# Patient Record
Sex: Female | Born: 1955 | Race: White | Hispanic: No | Marital: Single | Smoking: Former smoker
Health system: Southern US, Academic
[De-identification: ages and names within clinical notes are randomized; demographics above are authoritative.]

## PROBLEM LIST (undated history)

## (undated) DIAGNOSIS — E039 Hypothyroidism, unspecified: Secondary | ICD-10-CM

## (undated) DIAGNOSIS — I10 Essential (primary) hypertension: Secondary | ICD-10-CM

## (undated) DIAGNOSIS — L900 Lichen sclerosus et atrophicus: Secondary | ICD-10-CM

## (undated) DIAGNOSIS — Z9889 Other specified postprocedural states: Secondary | ICD-10-CM

## (undated) DIAGNOSIS — M8580 Other specified disorders of bone density and structure, unspecified site: Secondary | ICD-10-CM

## (undated) DIAGNOSIS — C4491 Basal cell carcinoma of skin, unspecified: Secondary | ICD-10-CM

## (undated) DIAGNOSIS — M1990 Unspecified osteoarthritis, unspecified site: Secondary | ICD-10-CM

## (undated) HISTORY — PX: SHOULDER ARTHROSCOPY: SHX128

## (undated) HISTORY — PX: CARPAL TUNNEL RELEASE: SHX101

## (undated) HISTORY — PX: TUBAL LIGATION: SHX77

## (undated) HISTORY — PX: KNEE ARTHROSCOPY: SUR90

## (undated) HISTORY — PX: SHOULDER SURGERY: SHX246

## (undated) HISTORY — PX: TRIGGER FINGER RELEASE: SHX641

## (undated) HISTORY — PX: MOHS SURGERY: SUR867

## (undated) HISTORY — DX: Hypothyroidism, unspecified: E03.9

## (undated) HISTORY — PX: HX TONSILLECTOMY: SHX27

## (undated) HISTORY — PX: MOLE REMOVAL: SHX2046

## (undated) HISTORY — DX: Essential (primary) hypertension: I10

## (undated) HISTORY — PX: HX WISDOM TEETH EXTRACTION: SHX21

## (undated) HISTORY — PX: DILATION AND CURETTAGE, DIAGNOSTIC / THERAPEUTIC: SUR384

---

## 2011-11-20 ENCOUNTER — Inpatient Hospital Stay (HOSPITAL_COMMUNITY): Payer: Self-pay | Admitting: INTERNAL MEDICINE

## 2011-11-20 ENCOUNTER — Other Ambulatory Visit: Payer: Self-pay

## 2011-11-21 LAB — HISTORICAL SURGICAL PATHOLOGY SPECIMEN

## 2019-08-29 HISTORY — PX: COLONOSCOPY: SHX174

## 2019-12-08 ENCOUNTER — Encounter: Payer: Self-pay | Admitting: Obstetrics & Gynecology

## 2019-12-22 ENCOUNTER — Other Ambulatory Visit (HOSPITAL_COMMUNITY)
Admission: RE | Admit: 2019-12-22 | Discharge: 2019-12-22 | Disposition: A | Discharge status: Home / Self Care | Payer: Self-pay | Source: Ambulatory Visit | Attending: Obstetrics & Gynecology | Admitting: Obstetrics & Gynecology

## 2019-12-22 ENCOUNTER — Encounter: Payer: Self-pay | Admitting: Obstetrics & Gynecology

## 2019-12-22 ENCOUNTER — Other Ambulatory Visit: Payer: Self-pay | Admitting: *Deleted

## 2019-12-22 ENCOUNTER — Ambulatory Visit: Payer: Self-pay | Admitting: Obstetrics & Gynecology

## 2019-12-22 ENCOUNTER — Other Ambulatory Visit: Payer: Self-pay

## 2019-12-22 VITALS — BP 150/88 | HR 83 | Ht 62.0 in | Wt 146.0 lb

## 2019-12-22 DIAGNOSIS — N904 Leukoplakia of vulva: Secondary | ICD-10-CM

## 2019-12-22 DIAGNOSIS — N952 Postmenopausal atrophic vaginitis: Secondary | ICD-10-CM

## 2019-12-22 MED ORDER — CLOBETASOL PROPIONATE 0.05 % EX OINT: 1.0000 "application " | TOPICAL_OINTMENT | Freq: Two times a day (BID) | CUTANEOUS | 0 refills | Status: DC

## 2019-12-22 MED ORDER — CLOBETASOL PROPIONATE 0.05 % EX OINT
1.0000 | TOPICAL_OINTMENT | Freq: Two times a day (BID) | CUTANEOUS | 0 refills | Status: DC
Start: 2019-12-22 — End: 2019-12-22

## 2019-12-22 NOTE — Patient Instructions (Signed)
Lichen Sclerosus Lichen sclerosus is a skin problem. It can happen on any part of the body, but it commonly involves the anal or genital areas. It can cause itching and discomfort in these areas. Treatment can help to control symptoms. When the genital area is affected, getting treatment is important because the condition can cause scarring that may lead to other problems. What are the causes? The cause of this condition is not known. It may be related to an overactive immune system or a lack of certain hormones. Lichen sclerosus is not an infection or a fungus, and it is not passed from one person to another (not contagious). What increases the risk? This condition is more likely to develop in women, usually after menopause. What are the signs or symptoms? Symptoms of this condition include:  Thin, wrinkled, white areas on the skin.  Thickened white areas on the skin.  Red and swollen patches (lesions) on the skin.  Tears or cracks in the skin.  Bruising.  Blood blisters.  Severe itching.  Pain, itching, or burning when urinating. Constipation is also common in people with lichen sclerosus. How is this diagnosed? This condition may be diagnosed with a physical exam. In some cases, a tissue sample (biopsy sample) may be removed to be looked at under a microscope. How is this treated? This condition is usually treated with medicated creams or ointments (topical steroids) that are applied over the affected areas. In some cases, treatment may also include medicines that are taken by mouth. Surgery may be needed in more severe cases that are causing problems such as scarring. Follow these instructions at home:  Take or use over-the-counter and prescription medicines only as told by your health care provider.  Use creams or ointments as told by your health care provider.  Do not scratch the affected areas of skin.  If you are a woman, be sure to keep the vaginal area as clean and dry  as possible.  Clean the affected area of skin gently with water. Avoid using rough towels or toilet paper.  Keep all follow-up visits as told by your health care provider. This is important. Contact a health care provider if:  You have increasing redness, swelling, or pain in the affected area.  You have fluid, blood, or pus coming from the affected area.  You have new lesions on your skin.  You have a fever.  You have pain during sex. Summary  Lichen sclerosus is a skin problem. When the genital area is affected, getting treatment is important because the condition can cause scarring that may lead to other problems.  This condition is usually treated with medicated creams or ointments (topical steroids) that are applied over the affected areas.  Take or use over-the-counter and prescription medicines only as told by your health care provider.  Contact a health care provider if you have new lesions on your skin, have pain during sex, or have increasing redness, swelling, or pain in the affected area.  Keep all follow-up visits as told by your health care provider. This is important. This information is not intended to replace advice given to you by your health care provider. Make sure you discuss any questions you have with your health care provider. Document Revised: 12/27/2017 Document Reviewed: 12/27/2017 Elsevier Patient Education  2020 Elsevier Inc.  

## 2019-12-22 NOTE — Progress Notes (Signed)
   Subjective:    Patient ID: Amanda Schultz, female    DOB: 1956-08-17, 64 y.o.   MRN: [id number]  HPI  64 yo female presents with vaginal itching and burning and occasional bleeding when she wipes.  The bleeding is coming from her external genitalia and can be seen with a mirror.  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  Approximately 3 years ago she started having the external pain and burning.  She is used Vagifem in the past which has not relieved her symptoms.  Patient is between insurances and would like to hold off on any expensive treatments or biopsies for 60 days until her insurance starts.  Review of Systems  Constitutional: Negative.   Respiratory: Negative.   Cardiovascular: Negative.   Gastrointestinal: Negative.   Genitourinary: Positive for dyspareunia and vaginal pain. Negative for vaginal discharge.  Psychiatric/Behavioral: Negative.       Objective:   Physical Exam Vitals reviewed.  Constitutional:      General: She is not in acute distress.    Appearance: She is well-developed.  HENT:     Head: Normocephalic and atraumatic.  Eyes:     Conjunctiva/sclera: Conjunctivae normal.  Cardiovascular:     Rate and Rhythm: Normal rate.  Pulmonary:     Effort: Pulmonary effort is normal.  Skin:    General: Skin is warm and dry.  Neurological:     Mental Status: She is alert and oriented to person, place, and time.   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  Patient will need biopsy to confirm lichen sclerosus.  Has insurance gap and would like to get the biopsy once her insurance is reinstated.  1.  Clobetasol  twice a day for 3 weeks.  Reviewed anatomy on picture with patient so she knows where to put the ointment. 2.  Patient cannot afford Vagifem at this time.  We will be using a compounded estrogen cream and she will insert this in the vagina twice a week. 3.  Patient should come back in 3 weeks for reevaluation.  If she is unable to afford the visit she will postpone we will talk by phone.  30 minutes spent face-to-face with patient with greater than 50% counseling.

## 2019-12-23 LAB — CERVICOVAGINAL ANCILLARY ONLY
Bacterial Vaginitis (gardnerella): NEGATIVE
Candida Glabrata: NEGATIVE
Candida Vaginitis: NEGATIVE
Comment: NEGATIVE
Comment: NEGATIVE
Comment: NEGATIVE

## 2019-12-24 ENCOUNTER — Telehealth: Payer: Self-pay | Admitting: *Deleted

## 2019-12-24 NOTE — Telephone Encounter (Signed)
Pt notified via phone that her Aptima swab was neg and she is to use the Estrogen and Steroid cream that Dr Penne Lash prescribed.

## 2019-12-24 NOTE — Telephone Encounter (Signed)
-----   Message from Lesly Dukes, MD sent at 12/23/2019 12:30 PM EDT ----- Pt needs a phone call (no my chart)

## 2020-01-19 ENCOUNTER — Other Ambulatory Visit: Payer: Self-pay

## 2020-01-19 ENCOUNTER — Encounter: Payer: Self-pay | Admitting: Obstetrics & Gynecology

## 2020-01-19 ENCOUNTER — Ambulatory Visit: Payer: Self-pay | Admitting: Obstetrics & Gynecology

## 2020-01-19 DIAGNOSIS — L309 Dermatitis, unspecified: Secondary | ICD-10-CM | POA: Insufficient documentation

## 2020-01-19 DIAGNOSIS — L900 Lichen sclerosus et atrophicus: Secondary | ICD-10-CM | POA: Insufficient documentation

## 2020-01-19 MED ORDER — MEDROXYPROGESTERONE ACETATE 10 MG PO TABS
10.0000 mg | ORAL_TABLET | Freq: Every day | ORAL | 2 refills | Status: DC
Start: 2020-01-19 — End: 2021-03-02

## 2020-01-19 NOTE — Progress Notes (Addendum)
   Subjective:    Patient ID: Amanda Schultz, female    DOB: 11/21/1955, 64 y.o.   MRN: [id number]  HPI  64 yo female presents for follow up of LS and atrophic vaginitis.  Pt using compounded cream due to cost.  Feels much better now.  Burning and tearing near the clitoris is resolved.    Review of Systems  Constitutional: Negative.   Respiratory: Negative.   Cardiovascular: Negative.   Gastrointestinal: Negative.   Genitourinary: Negative.  Negative for vaginal bleeding and vaginal discharge.       Objective:   Physical Exam Vitals reviewed.  Constitutional:      General: She is not in acute distress.    Appearance: She is well-developed.  HENT:     Head: Normocephalic and atraumatic.  Eyes:     Conjunctiva/sclera: Conjunctivae normal.  Cardiovascular:     Rate and Rhythm: Normal rate.  Pulmonary:     Effort: Pulmonary effort is normal.  Skin:    General: Skin is warm and dry.  Neurological:     Mental Status: She is alert and oriented to person, place, and time.    Vitals:   01/19/20 1505  BP: (!) 156/92  Pulse: 87  Weight: 147 lb (66.7 kg)  Height: 5\' 2"  (1.575 m)            Assessment & Plan:  64 yo female for follow up of LS and atrophic vaginitis.  1.  Continue clobetasol bid for one week then decrease to once a day. 2.  Continue compounded estrogen cream and provera w/d bleed last week of June to see if there is systemic absorption.   3.  Pt should be getting insurance coverage soon and will come back early July for biopsy.   Retake BP is 141/87  30 mins spent with patient during exam, counseling, and charting.

## 2020-01-19 NOTE — Patient Instructions (Signed)
   Pick up the provera once a day the last week of June  Continue twice a day clobetasol for one more week then once a week.  Make appt with me in early July for biopsy

## 2020-03-03 ENCOUNTER — Encounter (INDEPENDENT_AMBULATORY_CARE_PROVIDER_SITE_OTHER): Payer: Self-pay | Admitting: Family Medicine

## 2020-03-22 ENCOUNTER — Other Ambulatory Visit: Payer: Self-pay | Admitting: Obstetrics & Gynecology

## 2020-03-22 ENCOUNTER — Other Ambulatory Visit: Payer: Self-pay

## 2020-03-22 ENCOUNTER — Ambulatory Visit (INDEPENDENT_AMBULATORY_CARE_PROVIDER_SITE_OTHER): Payer: 59 | Admitting: Obstetrics & Gynecology

## 2020-03-22 ENCOUNTER — Encounter: Payer: Self-pay | Admitting: Obstetrics & Gynecology

## 2020-03-22 VITALS — Ht 62.0 in | Wt 148.0 lb

## 2020-03-22 DIAGNOSIS — N904 Leukoplakia of vulva: Secondary | ICD-10-CM

## 2020-03-22 DIAGNOSIS — N898 Other specified noninflammatory disorders of vagina: Secondary | ICD-10-CM

## 2020-03-22 DIAGNOSIS — N952 Postmenopausal atrophic vaginitis: Secondary | ICD-10-CM | POA: Diagnosis not present

## 2020-03-22 DIAGNOSIS — L909 Atrophic disorder of skin, unspecified: Secondary | ICD-10-CM

## 2020-03-23 MED ORDER — INTRAROSA 6.5 MG VA INST: VAGINAL_INSERT | VAGINAL | 5 refills | Status: DC

## 2020-03-23 NOTE — Progress Notes (Signed)
   Subjective:    Patient ID: Amanda Schultz, female    DOB: 1956/08/07, 64 y.o.   MRN: [id number]  HPI  64 yo female presents for vulvar biopsy (now has insurance) and re evaluation for LS and atrophic vaginitis.  Pt has been using meds as described.  She did not bled after provera.  Pt is using compounded estrogen progesterone cream in lieu of vagifem and intarasa due to insurance reasons.    Review of Systems  Constitutional: Negative.   Respiratory: Negative.   Cardiovascular: Negative.   Gastrointestinal: Negative.   Genitourinary:       Symptoms improved, less burning       Objective:   Physical Exam Vitals reviewed.  Constitutional:      General: She is not in acute distress.    Appearance: She is well-developed.  HENT:     Head: Normocephalic and atraumatic.  Eyes:     Conjunctiva/sclera: Conjunctivae normal.  Cardiovascular:     Rate and Rhythm: Normal rate.  Pulmonary:     Effort: Pulmonary effort is normal.  Genitourinary:    Comments: Vulva--LS improved Vagina--pal pink, improved Skin:    General: Skin is warm and dry.  Neurological:     Mental Status: She is alert and oriented to person, place, and time.    Vitals:   03/22/20 1423  Weight: 148 lb (67.1 kg)  Height: 5\' 2"  (1.575 m)       Assessment & Plan:  1.  Vulvar biopsy to confirm LS  VULVAR BIOPSY NOTE  The indications for vulvar biopsy (rule out neoplasia, establish lichen sclerosus diagnosis) were reviewed.   Risks of the biopsy including pain, bleeding, infection, inadequate specimen, scarring and need for additional procedures  were discussed. The patient stated understanding and agreed to undergo procedure today. Consent was signed,  time out performed.  The patient's vulva was prepped with Betadine. 1% lidocaine was injected into left labia majora. A 3-mm punch biopsy was done, biopsy tissue was picked up with sterile forceps and sterile scissors were used to excise the lesion.  Small  bleeding was noted and hemostasis was achieved using silver nitrate sticks.  The patient tolerated the procedure well. Post-procedure instructions  (pelvic rest for one week) were given to the patient. The patient is to call with heavy bleeding, fever greater than 100.4, foul smelling vaginal discharge or other concerns. The patient will be return to clinic in two weeks for discussion of results.  2.  Clobetasol twice a week 3.  Switch to Intrarosa if possible.  (coupon given).  Stop compounded estrogen cream / provera if able to switch to Intrarosa.

## 2020-03-24 ENCOUNTER — Telehealth: Payer: Self-pay

## 2020-03-24 DIAGNOSIS — L900 Lichen sclerosus et atrophicus: Secondary | ICD-10-CM

## 2020-03-24 MED ORDER — CLOBETASOL PROPIONATE 0.05 % EX OINT: 1.0000 "application " | TOPICAL_OINTMENT | CUTANEOUS | 0 refills | Status: DC

## 2020-03-24 NOTE — Telephone Encounter (Signed)
Pt called stating that Dr.Leggett told her she would send in refill of Temovate but, that it wasn't at the pharmacy. I reviewed Dr.Leggett's note that states she is to use Clobetasol twice a week and will send refill.

## 2020-03-30 ENCOUNTER — Other Ambulatory Visit: Payer: Self-pay | Admitting: *Deleted

## 2020-03-30 DIAGNOSIS — L900 Lichen sclerosus et atrophicus: Secondary | ICD-10-CM

## 2020-03-30 MED ORDER — CLOBETASOL PROPIONATE 0.05 % EX OINT: 1.0000 "application " | TOPICAL_OINTMENT | CUTANEOUS | 1 refills | Status: DC

## 2020-03-30 NOTE — Telephone Encounter (Signed)
Pt called requesting her pathology results.  The report showed Lichen Sclerosus.  She is using Clobetasol BIW.  I have reached out to Dr Penne Lash that she may contact the patient to discuss her results and when she would like to recheck her.  RF on Clobetasol sent to her pharmacy.

## 2020-05-11 ENCOUNTER — Other Ambulatory Visit: Payer: Self-pay

## 2020-05-11 ENCOUNTER — Encounter (INDEPENDENT_AMBULATORY_CARE_PROVIDER_SITE_OTHER): Payer: Self-pay | Admitting: Family Medicine

## 2020-05-11 ENCOUNTER — Ambulatory Visit: Payer: Medicare PPO | Attending: Family Medicine

## 2020-05-11 ENCOUNTER — Ambulatory Visit (INDEPENDENT_AMBULATORY_CARE_PROVIDER_SITE_OTHER): Payer: Medicare PPO | Admitting: Family Medicine

## 2020-05-11 VITALS — BP 124/82 | HR 67 | Temp 98.6°F | Resp 16 | Ht 63.0 in | Wt 138.0 lb

## 2020-05-11 DIAGNOSIS — I10 Essential (primary) hypertension: Secondary | ICD-10-CM

## 2020-05-11 DIAGNOSIS — Z124 Encounter for screening for malignant neoplasm of cervix: Secondary | ICD-10-CM

## 2020-05-11 DIAGNOSIS — Z1239 Encounter for other screening for malignant neoplasm of breast: Secondary | ICD-10-CM

## 2020-05-11 DIAGNOSIS — Z7689 Persons encountering health services in other specified circumstances: Secondary | ICD-10-CM

## 2020-05-11 DIAGNOSIS — Z1211 Encounter for screening for malignant neoplasm of colon: Secondary | ICD-10-CM

## 2020-05-11 DIAGNOSIS — Z0000 Encounter for general adult medical examination without abnormal findings: Secondary | ICD-10-CM

## 2020-05-11 DIAGNOSIS — E039 Hypothyroidism, unspecified: Secondary | ICD-10-CM

## 2020-05-11 LAB — LIPID PANEL
CHOL/HDL RATIO: 3.9
CHOLESTEROL: 272 mg/dL — ABNORMAL HIGH (ref 100–200)
HDL CHOL: 70 mg/dL (ref >=50)
LDL CALC: 177 mg/dL — ABNORMAL HIGH (ref <100)
NON-HDL: 202 mg/dL — ABNORMAL HIGH (ref <=190)
TRIGLYCERIDES: 125 mg/dL (ref <150)
VLDL CALC: 25 mg/dL (ref <30)

## 2020-05-11 LAB — COMPREHENSIVE METABOLIC PNL, FASTING
ALBUMIN: 4.2 g/dL (ref 3.4–4.8)
ALKALINE PHOSPHATASE: 73 U/L (ref 50–130)
ALT (SGPT): 26 U/L — ABNORMAL HIGH (ref 8–22)
ANION GAP: 11 mmol/L (ref 4–13)
AST (SGOT): 27 U/L (ref 8–45)
BILIRUBIN TOTAL: 0.4 mg/dL (ref 0.3–1.3)
BUN/CREA RATIO: 22 (ref 6–22)
BUN: 18 mg/dL (ref 8–25)
CALCIUM: 9.8 mg/dL (ref 8.8–10.2)
CHLORIDE: 100 mmol/L (ref 96–111)
CO2 TOTAL: 29 mmol/L (ref 23–31)
CREATININE: 0.81 mg/dL (ref 0.60–1.05)
ESTIMATED GFR: 77 mL/min/BSA (ref >=60)
GLUCOSE: 76 mg/dL (ref 70–99)
POTASSIUM: 3.8 mmol/L (ref 3.5–5.1)
PROTEIN TOTAL: 7.8 g/dL (ref 6.0–8.0)
SODIUM: 140 mmol/L (ref 136–145)

## 2020-05-11 LAB — THYROID STIMULATING HORMONE (SENSITIVE TSH): TSH: 5.545 u[IU]/mL — ABNORMAL HIGH (ref 0.430–3.550)

## 2020-05-11 MED ORDER — LEVOTHYROXINE 75 MCG TABLET
75.00 ug | ORAL_TABLET | Freq: Every morning | ORAL | 2 refills | Status: DC
Start: 2020-05-11 — End: 2020-05-13

## 2020-05-11 MED ORDER — TRIAMTERENE 37.5 MG-HYDROCHLOROTHIAZIDE 25 MG TABLET
1.0000 | ORAL_TABLET | Freq: Every day | ORAL | 2 refills | Status: DC
Start: 2020-05-11 — End: 2020-07-08

## 2020-05-11 NOTE — H&P (Signed)
[facility]                                                         [address] [place]   [place] [address]      Name: Katherine Shepard  Age: 64 y.o.  MRN: [id number]  Date:05/11/2020      Chief Complaint: Establish Care (Patient states that she is here to get established.)        HPI:  Katherine Shepard is a pleasant 64 y.o. patient here to establish care with me.  The patient takes the following medicaitons listed in the chart or/and from the patient's history  Current Outpatient Medications   Medication Sig   . levothyroxine (SYNTHROID) 75 mcg Oral Tablet Take 1 Tablet (75 mcg total) by mouth Every morning for 90 days   . triamterene-hydroCHLOROthiazide (MAXZIDE-25) 37.5-25 mg Oral Tablet Take 1 Tablet by mouth Once a day for 90 days      Management of the above conditions  -The patient takes the medications as prescribed. Stable overall.  -No complaints today  -Exercise involves. Avid walker  -Diet: Healthy, balanced, likes unsweetened tea but drinks plents  Due for Colon and mammogram sceening  Denies chest pain, SOB, syncope, lower leg edema, mood/behavoral abnormalities.   Review of Systems: Other than listed in the HPI negative for other systems.    Past Medical History:   Diagnosis Date   . Hypertension    . Hypothyroidism        Past Surgical History:   Procedure Laterality Date   . DILATION AND CURETTAGE, DIAGNOSTIC / THERAPEUTIC     . HX TONSILLECTOMY     . HX WISDOM TEETH EXTRACTION     . MOLE REMOVAL           Current Outpatient Medications   Medication Sig   . levothyroxine (SYNTHROID) 75 mcg Oral Tablet Take 1 Tablet (75 mcg total) by mouth Every morning for 90 days   . triamterene-hydroCHLOROthiazide (MAXZIDE-25) 37.5-25 mg Oral Tablet Take 1 Tablet by mouth Once a day for 90 days     No Known Allergies  Family Medical History:     Problem Relation (Age of Onset)    Heart Attack Father    Hypertension (High Blood Pressure) Mother, Father, Maternal Grandmother, Maternal  Grandfather    Thyroid Disease Mother, Father, Brother            Social History     Tobacco Use   . Smoking status: Former Games developer   . Smokeless tobacco: Never Used   Substance Use Topics   . Alcohol use: Yes          Objective :  Vitals:    05/11/20 1304   BP: 124/82   Pulse: 67   Resp: 16   Temp: 37 C (98.6 F)   TempSrc: Thermal Scan   SpO2: 98%   Weight: 62.6 kg (138 lb)   Height: 1.6 m (5\' 3" )   BMI: 24.5     Body mass index is 24.45 kg/m.  Nursing Notes:   , Benetta Spar  05/11/20 1308  Signed  BMI addressed: Advised on importance of nutrition to increase below normal BMI.     05/13/20, Benetta Spar  05/11/20 1303  Signed     05/11/20  1300   Depression Screen   Little interest or pleasure in doing things. 0   Feeling down, depressed, or hopeless 0     Physical Exam  Vitals and nursing note reviewed.   Constitutional:       Appearance: Normal appearance.   Cardiovascular:      Rate and Rhythm: Normal rate and regular rhythm.      Pulses: Normal pulses.      Heart sounds: Normal heart sounds. No murmur heard.   No friction rub. No gallop.    Pulmonary:      Effort: Pulmonary effort is normal.      Breath sounds: Normal breath sounds.   Musculoskeletal:         General: Normal range of motion.   Skin:     General: Skin is warm and dry.   Neurological:      Mental Status: She is alert.   Psychiatric:         Mood and Affect: Mood normal.         Behavior: Behavior normal.         Thought Content: Thought content normal.         Judgment: Judgment normal.         Assessment/Plan    Problem List Items Addressed This Visit        Cardiovascular System    Hypertension    Relevant Medications    triamterene-hydroCHLOROthiazide (MAXZIDE-25) 37.5-25 mg Oral Tablet    Other Relevant Orders    COMPREHENSIVE METABOLIC PNL, FASTING    LIPID PANEL       Endocrine    Hypothyroidism - Primary    Relevant Medications    levothyroxine (SYNTHROID) 75 mcg Oral Tablet    Other Relevant Orders    THYROID STIMULATING HORMONE  (SENSITIVE TSH)      Other Visit Diagnoses     Preventative health care        Encounter for screening for malignant neoplasm of breast, unspecified screening modality        Relevant Orders    MAMMO BILATERAL SCREENING-ADDL VIEWS/BREAST US AS REQ BY RAD    Colon cancer screening        Relevant Orders    Refer to [facility], [facility]    Cervical cancer screening             Above orutine tests  Mammogram/Colon/cervical screen referral  F/u in three months prn     Current Discharge Medication List          Accurate as of May 11, 2020  1:58 PM. If you have any questions, ask your nurse or doctor.            CONTINUE these medications - NO CHANGES were made during your visit.      Details   levothyroxine 75 mcg Tablet  Commonly known as: SYNTHROID   75 mcg, Oral, EVERY MORNING  Qty: 30 Tablet  Refills: 2     triamterene-hydroCHLOROthiazide 37.5-25 mg Tablet  Commonly known as: MAXZIDE-25   1 Tablet, Oral, DAILY  Qty: 30 Tablet  Refills: 2            I have reviewed Vitals and  chart in its entirety and concur with clinical staff on their assessments. BOP reports reviewed as required and discussed with patient.  All  Recent labs/procedures/ recent ED or quickcare visits reviwed if  received by our office . Requested if Not received  Patient / family has been  advised/counselled about medications prescribed and their benefits and side effects as per latest drug PI available. Also have counseled about compliance with advice and medications and lab follow up.I have answered all questions to patient's/family satisfaction regarding his medical managment.   The patient has been  informed to contact the office in 7 business days if a message/lab results/referral/imaging results have not been conveyed to the patient.   Follow-up as needed/dcoumented in chart  .    Return in about 3 months (around 08/10/2020).    Minervia Osso Jesusita Oka, MD

## 2020-05-11 NOTE — Nursing Note (Signed)
BMI addressed: Advised on importance of nutrition to increase below normal BMI.

## 2020-05-11 NOTE — Nursing Note (Signed)
05/11/20 1300   Depression Screen   Little interest or pleasure in doing things. 0   Feeling down, depressed, or hopeless 0

## 2020-05-12 NOTE — Progress Notes (Signed)
Results have been reviewed  Elevated TSH indicating Uncontrolled hypothyroidism.  Inquire if patient has been taking the thyroid medication.  For the last few weeks to few months.  If so medication needs to be adjusted and another laboratory TSH to be checked in 6 weeks

## 2020-05-13 ENCOUNTER — Telehealth (INDEPENDENT_AMBULATORY_CARE_PROVIDER_SITE_OTHER): Payer: Self-pay | Admitting: Family Medicine

## 2020-05-13 DIAGNOSIS — E039 Hypothyroidism, unspecified: Secondary | ICD-10-CM

## 2020-05-13 MED ORDER — LEVOTHYROXINE 88 MCG TABLET
88.0000 ug | ORAL_TABLET | Freq: Every morning | ORAL | 2 refills | Status: DC
Start: 2020-05-13 — End: 2020-08-16

## 2020-05-13 NOTE — Progress Notes (Signed)
Increased by 12.5 to and TSH ordered to be repeated on 06/26/2020

## 2020-05-13 NOTE — Telephone Encounter (Signed)
-----   Message from Dorothyann Peng, MD sent at 05/12/2020 12:03 PM EDT -----  Results have been reviewed  Elevated TSH indicating Uncontrolled hypothyroidism.  Inquire if patient has been taking the thyroid medication.  For the last few weeks to few months.  If so medication needs to be adjusted and another laboratory TSH to be checked in 6 weeks

## 2020-06-14 ENCOUNTER — Telehealth: Payer: Self-pay | Admitting: Obstetrics & Gynecology

## 2020-06-14 NOTE — Telephone Encounter (Signed)
Pt is on Intrarosa and was taking one week of Provera every 3 months in case the increase estrogen levels caused thickened endometrium.  After her second cycle of provera, she had 1 day of bright red spotting.  I am assuming this was in response to the progesterone.  I discussed the metabolism of Intrarosa into estrogen-like compounds that can affect the endometrium.  Patient verbalized understanding.  Patient is going to decrease the Intrarosa to 3 times a week.  She has good control of her symptoms.  He will continue to progesterone every 3 months.  She will let us know if she bleeds again.

## 2020-07-07 ENCOUNTER — Other Ambulatory Visit (INDEPENDENT_AMBULATORY_CARE_PROVIDER_SITE_OTHER): Payer: Self-pay | Admitting: Family Medicine

## 2020-07-07 DIAGNOSIS — I10 Essential (primary) hypertension: Secondary | ICD-10-CM

## 2020-07-26 ENCOUNTER — Ambulatory Visit (HOSPITAL_BASED_OUTPATIENT_CLINIC_OR_DEPARTMENT_OTHER): Payer: Self-pay | Admitting: OBSTETRICS/GYNECOLOGY

## 2020-08-10 ENCOUNTER — Ambulatory Visit (INDEPENDENT_AMBULATORY_CARE_PROVIDER_SITE_OTHER): Payer: Self-pay | Admitting: Family Medicine

## 2020-08-10 NOTE — Telephone Encounter (Signed)
Patient states that she needs a refill on her levothyroxine and wants to know if she needs lab work done first since it was resently adjusted?

## 2020-08-16 ENCOUNTER — Other Ambulatory Visit (INDEPENDENT_AMBULATORY_CARE_PROVIDER_SITE_OTHER): Payer: Self-pay | Admitting: Family Medicine

## 2020-08-16 DIAGNOSIS — E039 Hypothyroidism, unspecified: Secondary | ICD-10-CM

## 2020-08-16 NOTE — Telephone Encounter (Signed)
Patient asked for this refill and if she needed labs.

## 2020-08-17 MED ORDER — LEVOTHYROXINE 88 MCG TABLET
88.0000 ug | ORAL_TABLET | Freq: Every morning | ORAL | 2 refills | Status: DC
Start: 2020-08-17 — End: 2020-11-10

## 2020-08-31 ENCOUNTER — Ambulatory Visit (HOSPITAL_BASED_OUTPATIENT_CLINIC_OR_DEPARTMENT_OTHER): Payer: Self-pay

## 2020-09-07 ENCOUNTER — Encounter (INDEPENDENT_AMBULATORY_CARE_PROVIDER_SITE_OTHER): Payer: Self-pay | Admitting: Family Medicine

## 2020-09-21 ENCOUNTER — Other Ambulatory Visit: Payer: Self-pay

## 2020-09-21 ENCOUNTER — Encounter (INDEPENDENT_AMBULATORY_CARE_PROVIDER_SITE_OTHER): Payer: Self-pay | Admitting: NURSE PRACTITIONER

## 2020-09-21 ENCOUNTER — Ambulatory Visit (INDEPENDENT_AMBULATORY_CARE_PROVIDER_SITE_OTHER): Payer: Medicare PPO | Admitting: NURSE PRACTITIONER

## 2020-09-21 VITALS — BP 138/76 | HR 73 | Temp 98.2°F | Ht 63.0 in | Wt 142.0 lb

## 2020-09-21 DIAGNOSIS — J329 Chronic sinusitis, unspecified: Secondary | ICD-10-CM

## 2020-09-21 DIAGNOSIS — R0981 Nasal congestion: Secondary | ICD-10-CM

## 2020-09-21 DIAGNOSIS — F17200 Nicotine dependence, unspecified, uncomplicated: Secondary | ICD-10-CM

## 2020-09-21 MED ORDER — CEFDINIR 300 MG CAPSULE
300.0000 mg | ORAL_CAPSULE | Freq: Two times a day (BID) | ORAL | 0 refills | Status: AC
Start: 2020-09-21 — End: 2020-10-01

## 2020-09-21 MED ORDER — FEXOFENADINE 60 MG-PSEUDOEPHEDRINE ER 120 MG TABLET,EXT.RELEASE,12 HR
1.0000 | ORAL_TABLET | Freq: Two times a day (BID) | ORAL | 0 refills | Status: AC
Start: 2020-09-21 — End: 2020-09-28

## 2020-09-21 NOTE — Progress Notes (Deleted)
[address], [facility]  [address]  [place] [address]    Progress Note    Name: Katherine Shepard MRN:  [id number]   Date: 09/21/2020 Age: 65 y.o.         Reason for Visit: Ear Problem(s) (Bilat ear clogged with fluid, no pain. X3 weeks) and Sinus Problem (Sinus congestion with drainage and mucus)    History of Present Illness  Katherine Shepard is a 65 y.o. female who is being seen today for ***    Nursing Notes:  There are no exam notes on file for this visit.    Past Medical History:   Diagnosis Date   . Hypertension    . Hypothyroidism          Past Surgical History:   Procedure Laterality Date   . DILATION AND CURETTAGE, DIAGNOSTIC / THERAPEUTIC     . HX TONSILLECTOMY     . HX WISDOM TEETH EXTRACTION     . MOLE REMOVAL           Current Outpatient Medications   Medication Sig   . levothyroxine (SYNTHROID) 88 mcg Oral Tablet Take 1 Tablet (88 mcg total) by mouth Every morning for 90 days   . triamterene-hydroCHLOROthiazide (MAXZIDE-25) 37.5-25 mg Oral Tablet TAKE 1 TABLET BY MOUTH ONCE A DAY     Allergies   Allergen Reactions   . Erythromycin    . Tetracycline      Family Medical History:     Problem Relation (Age of Onset)    Heart Attack Father    Hypertension (High Blood Pressure) Mother, Father, Maternal Grandmother, Maternal Grandfather    Thyroid Disease Mother, Father, Brother          Social History     Tobacco Use   . Smoking status: Current Some Day Smoker   . Smokeless tobacco: Never Used   Vaping Use   . Vaping Use: Former   Substance Use Topics   . Alcohol use: Yes   . Drug use: Never       Review of Systems  ROS    Physical Exam:  BP 138/76   Pulse 73   Temp 36.8 C (98.2 F)   Ht 1.6 m (5\' 3" )   Wt 64.4 kg (142 lb)   SpO2 99%   BMI 25.15 kg/m       Physical Exam    Assessment:    No diagnosis found.        Plan:  ***    No orders of the defined types were placed in this encounter.      Follow up: No follow-ups on file.    Follow up with PCP.  Seek medical attention for new  or worsening symptoms.    This note was partially created using MModal Fluency Direct system (voice recognition software) and is inherently subject to errors including those of syntax and "sound-alike" substitutions which may escape proofreading.  In such instances, original meaning may be extrapolated by contextual derivation.     I saw the patient independently.    @The  supervising/collaborating physician for this visit was Dr. @         , FNP  09/21/2020, 08:59

## 2020-09-21 NOTE — Progress Notes (Signed)
[address], [facility]  [address]  [place] [address]    Progress Note    Name: Katherine Shepard MRN:  [id number]   Date: 09/21/2020 Age: 65 y.o.         Reason for Visit: Ear Problem(s) (Bilat ear clogged with fluid, no pain. X3 weeks) and Sinus Problem (Sinus congestion with drainage and mucus)    History of Present Illness  Katherine Shepard is a 65 y.o. female who is being seen today for The above stated symptoms, denies alleviating factors.    Nursing Notes:  There are no exam notes on file for this visit.    Past Medical History:   Diagnosis Date   . Hypertension    . Hypothyroidism          Past Surgical History:   Procedure Laterality Date   . DILATION AND CURETTAGE, DIAGNOSTIC / THERAPEUTIC     . HX TONSILLECTOMY     . HX WISDOM TEETH EXTRACTION     . MOLE REMOVAL           Current Outpatient Medications   Medication Sig   . cefdinir (OMNICEF) 300 mg Oral Capsule Take 1 Capsule (300 mg total) by mouth Twice daily for 10 days   . fexofenadine-pseudoephedrine (ALLEGRA-D) 60-120 mg Oral Tablet Sustained Release 12 hr Take 1 Tablet by mouth Twice daily for 7 days   . levothyroxine (SYNTHROID) 88 mcg Oral Tablet Take 1 Tablet (88 mcg total) by mouth Every morning for 90 days   . triamterene-hydroCHLOROthiazide (MAXZIDE-25) 37.5-25 mg Oral Tablet TAKE 1 TABLET BY MOUTH ONCE A DAY     Allergies   Allergen Reactions   . Erythromycin    . Tetracycline      Family Medical History:     Problem Relation (Age of Onset)    Heart Attack Father    Hypertension (High Blood Pressure) Mother, Father, Maternal Grandmother, Maternal Grandfather    Thyroid Disease Mother, Father, Brother          Social History     Tobacco Use   . Smoking status: Current Some Day Smoker   . Smokeless tobacco: Never Used   Vaping Use   . Vaping Use: Former   Substance Use Topics   . Alcohol use: Yes   . Drug use: Never       Review of Systems  Review of Systems   Constitutional: Negative for chills and fever.   HENT: Positive  for congestion, ear pain and sinus pain.        Physical Exam:  BP 138/76   Pulse 73   Temp 36.8 C (98.2 F)   Ht 1.6 m (5\' 3" )   Wt 64.4 kg (142 lb)   SpO2 99%   BMI 25.15 kg/m       Physical Exam  Vitals and nursing note reviewed.   Constitutional:       General: She is not in acute distress.  HENT:      Head: Normocephalic and atraumatic.      Right Ear: Tympanic membrane, ear canal and external ear normal.      Left Ear: Tympanic membrane, ear canal and external ear normal.      Ears:      Comments: Some cerumen buildup bilat. T tubes intact bilat     Nose: Congestion present.      Right Sinus: Maxillary sinus tenderness and frontal sinus tenderness present.  Left Sinus: Maxillary sinus tenderness and frontal sinus tenderness present.      Mouth/Throat:      Mouth: Mucous membranes are moist.      Pharynx: Oropharynx is clear.   Eyes:      Conjunctiva/sclera: Conjunctivae normal.   Cardiovascular:      Rate and Rhythm: Normal rate and regular rhythm.      Heart sounds: Normal heart sounds.   Pulmonary:      Effort: Pulmonary effort is normal. No respiratory distress.      Breath sounds: Normal breath sounds.   Lymphadenopathy:      Cervical: No cervical adenopathy.   Skin:     General: Skin is warm and dry.   Neurological:      Mental Status: She is alert and oriented to person, place, and time.   Psychiatric:         Mood and Affect: Affect normal.         Judgment: Judgment normal.         Assessment:    Sinusitis, unspecified chronicity, unspecified location    Sinus congestion        Plan:  Pt declined covid testing.  Reviewed last GFR    Orders Placed This Encounter   . cefdinir (OMNICEF) 300 mg Oral Capsule   . fexofenadine-pseudoephedrine (ALLEGRA-D) 60-120 mg Oral Tablet Sustained Release 12 hr       Follow up: No follow-ups on file.    Follow up with PCP.  Seek medical attention for new or worsening symptoms.    This note was partially created using MModal Fluency Direct system (voice  recognition software) and is inherently subject to errors including those of syntax and "sound-alike" substitutions which may escape proofreading.  In such instances, original meaning may be extrapolated by contextual derivation.     I saw the patient independently.            Jeani Sow, FNP  09/21/2020, 09:04

## 2020-09-28 ENCOUNTER — Ambulatory Visit (HOSPITAL_BASED_OUTPATIENT_CLINIC_OR_DEPARTMENT_OTHER): Payer: Medicare PPO | Admitting: OBSTETRICS/GYNECOLOGY

## 2020-10-27 ENCOUNTER — Telehealth: Payer: Self-pay | Admitting: *Deleted

## 2020-10-27 NOTE — Telephone Encounter (Signed)
Denial from [facility] for Intarosa.  Pharmacy made aware/

## 2020-11-01 NOTE — H&P (Unsigned)
GASTROENTEROLOGY, MEDICAL OFFICE BUILDING B  [address]  [place] [address]  [phone]    History and Physical  Name: Katherine Shepard  MRN: [id number]  DOB: Oct 08, 1955  Age: 65 y.o.  Date: 11/02/2020  Referring provider: Dorothyann Peng, MD    Reason for Visit: No chief complaint on file.    History of Present Illness:  Katherine Shepard is a 65 y.o. female who presents today for ***.  previous colonoscopy  family history of colon cancer or colon polyps.   Bowel movements  Denies fevers, chills, nausea, vomiting, heartburn, reflux, melena, hematochezia, or unintentional weight loss.     Patient History:  Past Medical History:   Diagnosis Date   . Hypertension    . Hypothyroidism          Past Surgical History:   Procedure Laterality Date   . DILATION AND CURETTAGE, DIAGNOSTIC / THERAPEUTIC     . HX TONSILLECTOMY     . HX WISDOM TEETH EXTRACTION     . MOLE REMOVAL           Current Outpatient Medications   Medication Sig   . levothyroxine (SYNTHROID) 88 mcg Oral Tablet Take 1 Tablet (88 mcg total) by mouth Every morning for 90 days   . triamterene-hydroCHLOROthiazide (MAXZIDE-25) 37.5-25 mg Oral Tablet TAKE 1 TABLET BY MOUTH ONCE A DAY     Allergies   Allergen Reactions   . Erythromycin    . Tetracycline      Family Medical History:     Problem Relation (Age of Onset)    Heart Attack Father    Hypertension (High Blood Pressure) Mother, Father, Maternal Grandmother, Maternal Grandfather    Thyroid Disease Mother, Father, Brother          Social History     Socioeconomic History   . Marital status: Single     Spouse name: Not on file   . Number of children: Not on file   . Years of education: Not on file   . Highest education level: Not on file   Occupational History   . Not on file   Tobacco Use   . Smoking status: Current Some Day Smoker   . Smokeless tobacco: Never Used   Vaping Use   . Vaping Use: Former   Substance and Sexual Activity   . Alcohol use: Yes   . Drug use: Never   . Sexual activity: Not Currently    Other Topics Concern   . Not on file   Social History Narrative   . Not on file     Social Determinants of Health     Financial Resource Strain: Not on file   Food Insecurity: Not on file   Transportation Needs: Not on file   Physical Activity: Not on file   Stress: Not on file   Intimate Partner Violence: Not on file   Housing Stability: Not on file     Review of Systems:                                      All other review of systems negative.     Physical Exam:  There were no vitals taken for this visit.      Physical Exam  Constitutional:       Appearance: She is well-developed.   HENT:      Head: Normocephalic.  Eyes:      Pupils: Pupils are equal, round, and reactive to light.   Cardiovascular:      Rate and Rhythm: Normal rate and regular rhythm.      Heart sounds: Normal heart sounds.   Pulmonary:      Effort: Pulmonary effort is normal.      Breath sounds: Normal breath sounds.   Abdominal:      General: Bowel sounds are normal. There is no distension.      Palpations: Abdomen is soft. There is no mass.      Tenderness: There is no abdominal tenderness. There is no guarding or rebound.   Musculoskeletal:         General: Normal range of motion.   Skin:     General: Skin is warm.      Findings: No erythema.   Neurological:      Mental Status: She is alert and oriented to person, place, and time.       Assessment and Plan:    Will schedule patient for screening colonoscopy. Discussed risks of procedure which include but are not limited to possible risk of bleeding, infection, perforation, anesthesia complications, and reviewed prep instructions, patient voiced understanding and wishes to proceed. Follow-up PRN.      Tama Headings, FNP-C  11/01/2020, 12:59    This note may have been partially generated using MModal Fluency Direct system, and there may be some incorrect words, spellings, and punctuation that were not noted in checking the note before saving, though effort was made to avoid such errors.

## 2020-11-02 ENCOUNTER — Ambulatory Visit (HOSPITAL_BASED_OUTPATIENT_CLINIC_OR_DEPARTMENT_OTHER): Payer: Self-pay | Admitting: Nurse Practitioner

## 2020-11-09 ENCOUNTER — Other Ambulatory Visit (INDEPENDENT_AMBULATORY_CARE_PROVIDER_SITE_OTHER): Payer: Self-pay | Admitting: Family Medicine

## 2020-11-09 DIAGNOSIS — E039 Hypothyroidism, unspecified: Secondary | ICD-10-CM

## 2020-11-09 NOTE — Telephone Encounter (Signed)
Please forward to Dr. Dan.    Katherine Shepard D Avyan Livesay, MD

## 2020-11-10 ENCOUNTER — Other Ambulatory Visit (INDEPENDENT_AMBULATORY_CARE_PROVIDER_SITE_OTHER): Payer: Self-pay | Admitting: Family Medicine

## 2020-11-10 DIAGNOSIS — E039 Hypothyroidism, unspecified: Secondary | ICD-10-CM

## 2020-11-10 NOTE — Telephone Encounter (Signed)
Left vm asking pt to return call.   Cathi Roan, MA  11/10/2020, 12:58

## 2020-11-10 NOTE — Telephone Encounter (Signed)
Left voicemail on answering machine to call office back.

## 2020-11-10 NOTE — Telephone Encounter (Signed)
Pt is coming in tomorrow for her TSH

## 2020-11-10 NOTE — Telephone Encounter (Signed)
Need TSH checked before this can be refilled.  Needs to do this immediately please  Thanks

## 2020-11-11 MED ORDER — LEVOTHYROXINE 88 MCG TABLET
88.0000 ug | ORAL_TABLET | Freq: Every morning | ORAL | 1 refills | Status: DC
Start: 2020-11-11 — End: 2020-12-01

## 2020-11-15 ENCOUNTER — Other Ambulatory Visit: Payer: Self-pay

## 2020-11-15 ENCOUNTER — Ambulatory Visit: Payer: Medicare PPO | Attending: Family Medicine

## 2020-11-15 DIAGNOSIS — E039 Hypothyroidism, unspecified: Secondary | ICD-10-CM | POA: Insufficient documentation

## 2020-11-15 LAB — THYROXINE, FREE (FREE T4): THYROXINE (T4), FREE: 1.24 ng/dL (ref 0.70–1.25)

## 2020-11-15 LAB — THYROID STIMULATING HORMONE WITH FREE T4 REFLEX: TSH: 0.4 u[IU]/mL — ABNORMAL LOW (ref 0.430–3.550)

## 2020-11-15 LAB — THYROID STIMULATING HORMONE (SENSITIVE TSH): TSH: 0.4 u[IU]/mL — ABNORMAL LOW (ref 0.430–3.550)

## 2020-11-17 ENCOUNTER — Ambulatory Visit (HOSPITAL_BASED_OUTPATIENT_CLINIC_OR_DEPARTMENT_OTHER): Payer: Medicare PPO | Admitting: OBSTETRICS/GYNECOLOGY

## 2020-11-20 ENCOUNTER — Other Ambulatory Visit: Payer: Self-pay | Admitting: Obstetrics & Gynecology

## 2020-12-01 ENCOUNTER — Encounter (INDEPENDENT_AMBULATORY_CARE_PROVIDER_SITE_OTHER): Payer: Self-pay | Admitting: Family Medicine

## 2020-12-01 ENCOUNTER — Ambulatory Visit: Payer: Medicare PPO | Attending: Family Medicine | Admitting: Family Medicine

## 2020-12-01 ENCOUNTER — Other Ambulatory Visit: Payer: Self-pay

## 2020-12-01 DIAGNOSIS — E039 Hypothyroidism, unspecified: Secondary | ICD-10-CM

## 2020-12-01 DIAGNOSIS — I10 Essential (primary) hypertension: Secondary | ICD-10-CM | POA: Insufficient documentation

## 2020-12-01 MED ORDER — TRIAMTERENE 37.5 MG-HYDROCHLOROTHIAZIDE 25 MG TABLET
1.0000 | ORAL_TABLET | Freq: Every day | ORAL | 0 refills | Status: DC
Start: 2020-12-01 — End: 2021-03-01

## 2020-12-01 MED ORDER — LEVOTHYROXINE 75 MCG TABLET
75.0000 ug | ORAL_TABLET | Freq: Every morning | ORAL | 0 refills | Status: DC
Start: 2020-12-01 — End: 2021-04-12

## 2020-12-01 NOTE — Progress Notes (Signed)
[facility]                                                         [address] [place]   [place] [address]    Name: Katherine Shepard  Age: 65 y.o.  MRN: [id number]  Date:12/01/2020    Chief Complaint: Lab Results (Pt states she is here for lab results. )   HPI:  Katherine Shepard is a pleasant 64 y.o. patient here for a follow up  The following conditions, either ongoing or resolved are listed in the chart.  Patient Active Problem List   Diagnosis   . Hypertension   . Hypothyroidism      The patient takes the following medications listed in the chart or/and from the patient's history  Current Outpatient Medications   Medication Sig   . levothyroxine (SYNTHROID) 75 mcg Oral Tablet Take 1 Tablet (75 mcg total) by mouth Every morning for 30 days   . triamterene-hydroCHLOROthiazide (MAXZIDE-25) 37.5-25 mg Oral Tablet Take 1 Tablet by mouth Once a day for 90 days      Management of the above conditions  -The patient takes the medications as prescribed. Stable overall.  -Current complaints-hypothyroidism,   THYROID STIMULATING HORMONE  Lab Results   Component Value Date    TSH 0.400 (L) 11/15/2020    TSH 0.400 (L) 11/15/2020     Reports no chest pain, SOB, syncope, lower leg edema, mood/behavorial abnormalities.   Review of Systems: Other than listed in the HPI negative for other systems.  Allergies   Allergen Reactions   . Erythromycin    . Tetracycline      Objective :  Vitals:    12/01/20 1550   BP: 128/74   Pulse: 75   Resp: 18   Temp: 36.5 C (97.7 F)   TempSrc: Oral   SpO2: 98%   Weight: (!) 140 kg (309 lb 1.4 oz)   Height: 1.6 m (5\' 3" )   BMI: 54.87     Body mass index is 54.75 kg/m.  Nursing Notes:   , [facility]  12/01/20 1550  Signed     12/01/20 1550   Depression Screen   Little interest or pleasure in doing things. 0   Feeling down, depressed, or hopeless 0   PHQ 2 Total 0     Physical Exam  Vitals and nursing note reviewed.   Cardiovascular:      Rate and Rhythm: Normal rate and  regular rhythm.      Pulses: Normal pulses.      Heart sounds: Normal heart sounds.   Pulmonary:      Effort: Pulmonary effort is normal.      Breath sounds: Normal breath sounds.   Psychiatric:         Mood and Affect: Mood normal.         Behavior: Behavior normal.         Thought Content: Thought content normal.         PMH, Surgical H/o , FH, Social H/o, Medications, allergies reviewed and updated as appropriate     BASIC METABOLIC PANEL  Lab Results   Component Value Date    SODIUM 140 05/11/2020    POTASSIUM 3.8 05/11/2020    CHLORIDE 100 05/11/2020    CO2  29 05/11/2020    ANIONGAP 11 05/11/2020    BUN 18 05/11/2020    CREATININE 0.81 05/11/2020    BUNCRRATIO 22 05/11/2020    GFR 77 05/11/2020    CALCIUM 9.8 05/11/2020    GLUCOSENF 76 05/11/2020         No results found for: GLUCOSEFAST   Lab Results   Component Value Date    CHOLESTEROL 272 (H) 05/11/2020    HDLCHOL 70 05/11/2020    LDLCHOL 177 (H) 05/11/2020    TRIG 125 05/11/2020       Hepatic Function  Lab Results   Component Value Date    ALBUMIN 4.2 05/11/2020    TOTALPROTEIN 7.8 05/11/2020    ALKPHOS 73 05/11/2020    AST 27 05/11/2020    ALT 26 (H) 05/11/2020   Renal Function   Lab Results   Component Value Date    SODIUM 140 05/11/2020    POTASSIUM 3.8 05/11/2020    CHLORIDE 100 05/11/2020    CO2 29 05/11/2020    ANIONGAP 11 05/11/2020    BUN 18 05/11/2020    CREATININE 0.81 05/11/2020    CALCIUM 9.8 05/11/2020    ALBUMIN 4.2 05/11/2020       THYROID STIMULATING HORMONE  Lab Results   Component Value Date    TSH 0.400 (L) 11/15/2020    TSH 0.400 (L) 11/15/2020     Assessment/Plan    Problem List Items Addressed This Visit        Endocrine    Hypothyroidism    Relevant Medications    levothyroxine (SYNTHROID) 75 mcg Oral Tablet    Other Relevant Orders    THYROID STIMULATING HORMONE (SENSITIVE TSH)      Other Visit Diagnoses     Essential hypertension        Relevant Medications    triamterene-hydroCHLOROthiazide (MAXZIDE-25) 37.5-25 mg Oral Tablet       Decrease dose of levothyroxine, recheck TSH in 6 weeks  Diagnostics/therapeutics/referrals per the above diagnosis/diagnoses  Follow up on the results.  Follow up in person or thorough phone/electronic/mail per the clinical need/complexity  Patient to let us know if any concerns/symptoms  If all is well and no complaints patient can be seen again per the below listed follow up schedule.  It is advised patient complete tests/preventative care recommendations before next visit as needed and recommended      Current Discharge Medication List          Accurate as of December 01, 2020  4:25 PM. If you have any questions, ask your nurse or doctor.            CONTINUE these medications which have CHANGED during your visit.      Details   levothyroxine 75 mcg Tablet  Commonly known as: SYNTHROID  What changed:    medication strength   how much to take  Changed by: Med Jesusita Oka, MD   75 mcg, Oral, EVERY MORNING  Qty: 30 Tablet  Refills: 0        CONTINUE these medications - NO CHANGES were made during your visit.      Details   triamterene-hydroCHLOROthiazide 37.5-25 mg Tablet  Commonly known as: MAXZIDE-25   1 Tablet, Oral, DAILY  Qty: 90 Tablet  Refills: 0          I have reviewed Vitals and  chart in its entirety and concur with clinical staff on their assessments. BOP reports reviewed as required and discussed with patient.  All  Recent labs/procedures/ recent ED or quick care visits reviewed if  received by our office . Requested if Not received   Patient / family has been  advised/counseled about medications prescribed and their benefits and side effects as per latest drug PI available. Also have counseled about compliance with advice and medications and lab follow up.I have answered all questions to patient's/family satisfaction regarding his medical management.   The patient has been  informed to contact the office in 7 business days if a message/lab results/referral/imaging results have not been conveyed to the  patient.   Follow-up as needed/documented in chart    Return in about 3 months (around 03/02/2021).    Med Jesusita Oka, MD

## 2020-12-01 NOTE — Nursing Note (Signed)
12/01/20 1550   Depression Screen   Little interest or pleasure in doing things. 0   Feeling down, depressed, or hopeless 0   PHQ 2 Total 0

## 2020-12-06 ENCOUNTER — Ambulatory Visit: Payer: 59 | Admitting: Obstetrics & Gynecology

## 2020-12-27 ENCOUNTER — Ambulatory Visit: Payer: Medicare Other | Admitting: Obstetrics & Gynecology

## 2021-01-03 ENCOUNTER — Other Ambulatory Visit: Payer: Self-pay

## 2021-01-03 ENCOUNTER — Ambulatory Visit: Payer: Medicare Other | Admitting: Obstetrics & Gynecology

## 2021-01-03 ENCOUNTER — Encounter: Payer: Self-pay | Admitting: Obstetrics & Gynecology

## 2021-01-03 VITALS — BP 165/91 | HR 93 | Resp 16 | Ht 62.0 in | Wt 144.0 lb

## 2021-01-03 DIAGNOSIS — N952 Postmenopausal atrophic vaginitis: Secondary | ICD-10-CM

## 2021-01-03 DIAGNOSIS — L900 Lichen sclerosus et atrophicus: Secondary | ICD-10-CM | POA: Diagnosis not present

## 2021-01-03 DIAGNOSIS — R030 Elevated blood-pressure reading, without diagnosis of hypertension: Secondary | ICD-10-CM | POA: Diagnosis not present

## 2021-01-03 MED ORDER — MEDROXYPROGESTERONE ACETATE 10 MG PO TABS
10.0000 mg | ORAL_TABLET | Freq: Every day | ORAL | 0 refills | Status: DC
Start: 2021-01-03 — End: 2021-03-02

## 2021-01-03 MED ORDER — ESTRADIOL 10 MCG VA TABS
ORAL_TABLET | VAGINAL | 12 refills | Status: DC
Start: 2021-01-03 — End: 2023-03-19

## 2021-01-03 MED ORDER — CLOBETASOL PROPIONATE 0.05 % EX OINT: 1.0000 "application " | TOPICAL_OINTMENT | CUTANEOUS | 1 refills | Status: DC

## 2021-01-03 NOTE — Progress Notes (Signed)
Pt states that she ran out of Provera and wasn't able to get a RF.Vaginal bump on Left vaginal area.

## 2021-01-03 NOTE — Progress Notes (Signed)
   Subjective:    Patient ID: Amanda Schultz, female    DOB: 05-31-1956, 65 y.o.   MRN: [id number]  HPI 65 year old female presents for request of change of medications for atrophic vaginitis.  Patient recently became on a Medicare advantage plan and they will not pay for Intrarosa.  They do pay for estradiol vaginal tablets.  Patient can stop using progesterone since she is moving to a generic Vagifem.  Patient has had some increased vaginal burning and would like me to look at her lichen sclerosus.  Patient has undergone a stressful time lately.  Her husband accidentally shot himself in the leg with a gun and needed surgery and rehab.  Last pap Nov 2019, normal co testing  Review of Systems  Constitutional: Negative.   Respiratory: Negative.   Cardiovascular: Negative.   Gastrointestinal: Negative.   Genitourinary: Negative.        Objective:   Physical Exam Vitals reviewed.  Constitutional:      General: She is not in acute distress.    Appearance: She is well-developed.  HENT:     Head: Normocephalic and atraumatic.  Eyes:     Conjunctiva/sclera: Conjunctivae normal.  Cardiovascular:     Rate and Rhythm: Normal rate.  Pulmonary:     Effort: Pulmonary effort is normal.  Genitourinary:   Skin:    General: Skin is warm and dry.  Neurological:     Mental Status: She is alert and oriented to person, place, and time.    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  Due to insurance changes, we will switch to estradiol generic Vagifem.  Patient can stop taking Provera every 3 months. 2.  Reviewed placement of clobetasol.  Patient had been placing it more on the labia majora.  Patient encouraged to spread labia majora and place at the fourchette of the vagina as demonstrated on the diagram in the physical exam  portion of this note. 3.  RTC 6 months or sooner if not doing better.   33 minutes were spent reviewing patient's records, biopsy reports, physical exam and interview, and counseling.

## 2021-01-03 NOTE — Patient Instructions (Signed)
Use clobetasol twice a day for 1 week, then once a day for 2 weeks, then twice a week if feeling better.  If not feeling better, then continue daily .  Call us if not better.

## 2021-02-03 ENCOUNTER — Encounter (INDEPENDENT_AMBULATORY_CARE_PROVIDER_SITE_OTHER): Payer: Self-pay

## 2021-03-01 ENCOUNTER — Other Ambulatory Visit (INDEPENDENT_AMBULATORY_CARE_PROVIDER_SITE_OTHER): Payer: Self-pay | Admitting: Family Medicine

## 2021-03-01 DIAGNOSIS — I10 Essential (primary) hypertension: Secondary | ICD-10-CM

## 2021-03-02 ENCOUNTER — Ambulatory Visit (INDEPENDENT_AMBULATORY_CARE_PROVIDER_SITE_OTHER): Payer: Medicare Other | Admitting: Obstetrics & Gynecology

## 2021-03-02 ENCOUNTER — Encounter: Payer: Self-pay | Admitting: Obstetrics & Gynecology

## 2021-03-02 ENCOUNTER — Other Ambulatory Visit (HOSPITAL_COMMUNITY)
Admission: RE | Admit: 2021-03-02 | Discharge: 2021-03-02 | Disposition: A | Discharge status: Home / Self Care | Payer: Medicare Other | Source: Ambulatory Visit | Attending: Obstetrics & Gynecology | Admitting: Obstetrics & Gynecology

## 2021-03-02 ENCOUNTER — Other Ambulatory Visit: Payer: Self-pay

## 2021-03-02 VITALS — BP 167/87 | HR 89 | Resp 16 | Ht 62.0 in | Wt 140.0 lb

## 2021-03-02 DIAGNOSIS — Z1151 Encounter for screening for human papillomavirus (HPV): Secondary | ICD-10-CM | POA: Insufficient documentation

## 2021-03-02 DIAGNOSIS — L900 Lichen sclerosus et atrophicus: Secondary | ICD-10-CM

## 2021-03-02 DIAGNOSIS — Z01419 Encounter for gynecological examination (general) (routine) without abnormal findings: Secondary | ICD-10-CM | POA: Insufficient documentation

## 2021-03-02 DIAGNOSIS — N952 Postmenopausal atrophic vaginitis: Secondary | ICD-10-CM

## 2021-03-02 MED ORDER — INTRAROSA 6.5 MG VA INST: VAGINAL_INSERT | VAGINAL | 3 refills | Status: DC

## 2021-03-02 MED ORDER — CLOBETASOL PROPIONATE 0.05 % EX OINT: 1.0000 "application " | TOPICAL_OINTMENT | CUTANEOUS | 1 refills | Status: DC

## 2021-03-02 MED ORDER — MEDROXYPROGESTERONE ACETATE 10 MG PO TABS: 10.0000 mg | ORAL_TABLET | Freq: Every day | ORAL | 2 refills | Status: DC

## 2021-03-02 NOTE — Progress Notes (Signed)
Subjective:     Amanda Schultz is a 65 y.o. female here for a routine exam and evaluation of atrophic vaginitis and LS.  Current complaints: feels better with change in clobetasol application.     Gynecologic History No LMP recorded. Patient is postmenopausal. Contraception: post menopausal status Last Pap: 3 years ago. Results were: normal per patient Last mammogram: 2020 at [facility]. Results were: normal  Obstetric History OB History  Gravida Para Term Preterm AB Living  0 0 0 0 0 0  SAB IAB Ectopic Multiple Live Births  0 0 0 0 0     The following portions of the patient's history were reviewed and updated as appropriate: allergies, current medications, past family history, past medical history, past social history, past surgical history, and problem list.  Review of Systems Pertinent items noted in HPI and remainder of comprehensive ROS otherwise negative.    Objective:     Vitals:   03/02/21 0848  BP: (!) 167/87  Pulse: 89  Resp: 16  Weight: 140 lb (63.5 kg)  Height: 5\' 2"  (1.575 m)   Vitals:  WNL General appearance: alert, cooperative and no distress  HEENT: Normocephalic, without obvious abnormality, atraumatic Eyes: negative Throat: lips, mucosa, and tongue normal; teeth and gums normal  Respiratory: Clear to auscultation bilaterally  CV: Regular rate and rhythm  Breasts:  Normal appearance, no masses or tenderness, no nipple retraction or dimpling  GI: Soft, non-tender; bowel sounds normal; no masses,  no organomegaly  GU: External Genitalia:  Tanner V, no lesion Urethra:  No prolapse   Vagina: Pink, normal rugae, no blood or discharge  Cervix: No CMT, no lesion  Uterus:  Normal size and contour, non tender  Adnexa: Normal, no masses, non tender  Musculoskeletal: No edema, redness or tenderness in the calves or thighs  Skin: No lesions or rash  Lymphatic: Axillary adenopathy: none     Psychiatric: Normal mood and behavior   Assessment:    Healthy female  exam.    Plan:    1.  Pap with co testing 2.  Yearly mammograms 3.  Patient would like to continue Intrarosa due to cost of Vagifem.  Patient is going to decrease to twice a week then once a week with the goal of possibly stopping.  Given that she had bleeding once after Intrarosa.  We will try Provera withdrawal bleeds every 3 months. 4.  Continue using clobetasol twice a week for lichen sclerosus 5.  Follow-up with PCP for hypertension and other medical concerns. 6.  DEXA ordered.  Patient given calcium diet worksheet to ensure that she is getting 1200 mg of calcium a day as well as 400 units of vitamin D.  Weight bearing exercise encouraged.

## 2021-03-03 ENCOUNTER — Encounter (INDEPENDENT_AMBULATORY_CARE_PROVIDER_SITE_OTHER): Payer: Self-pay | Admitting: Family Medicine

## 2021-03-08 LAB — CYTOLOGY - PAP
Comment: NEGATIVE
Diagnosis: NEGATIVE
High risk HPV: NEGATIVE

## 2021-03-14 ENCOUNTER — Ambulatory Visit (HOSPITAL_BASED_OUTPATIENT_CLINIC_OR_DEPARTMENT_OTHER): Payer: Medicare PPO | Admitting: Family Medicine

## 2021-03-14 DIAGNOSIS — Z0000 Encounter for general adult medical examination without abnormal findings: Secondary | ICD-10-CM

## 2021-03-23 ENCOUNTER — Ambulatory Visit (INDEPENDENT_AMBULATORY_CARE_PROVIDER_SITE_OTHER): Payer: Medicare Other

## 2021-03-23 ENCOUNTER — Other Ambulatory Visit: Payer: Self-pay

## 2021-03-23 DIAGNOSIS — Z1231 Encounter for screening mammogram for malignant neoplasm of breast: Secondary | ICD-10-CM

## 2021-03-23 DIAGNOSIS — Z780 Asymptomatic menopausal state: Secondary | ICD-10-CM | POA: Diagnosis not present

## 2021-03-23 DIAGNOSIS — M8580 Other specified disorders of bone density and structure, unspecified site: Secondary | ICD-10-CM | POA: Diagnosis not present

## 2021-03-23 DIAGNOSIS — Z01419 Encounter for gynecological examination (general) (routine) without abnormal findings: Secondary | ICD-10-CM

## 2021-04-12 ENCOUNTER — Other Ambulatory Visit (INDEPENDENT_AMBULATORY_CARE_PROVIDER_SITE_OTHER): Payer: Self-pay | Admitting: Family Medicine

## 2021-04-12 DIAGNOSIS — I10 Essential (primary) hypertension: Secondary | ICD-10-CM

## 2021-04-12 DIAGNOSIS — E039 Hypothyroidism, unspecified: Secondary | ICD-10-CM

## 2021-04-13 ENCOUNTER — Encounter (INDEPENDENT_AMBULATORY_CARE_PROVIDER_SITE_OTHER): Payer: Self-pay

## 2021-04-13 MED ORDER — LEVOTHYROXINE 75 MCG TABLET
75.0000 ug | ORAL_TABLET | Freq: Every morning | ORAL | 1 refills | Status: DC
Start: 2021-04-13 — End: 2021-04-21

## 2021-04-13 NOTE — Telephone Encounter (Signed)
Refill on July 5,2022 was for a 3 month supply

## 2021-04-14 NOTE — Telephone Encounter (Signed)
Due f/u appt  Due TSH ordered not completed  Get above done before refilling

## 2021-04-14 NOTE — Telephone Encounter (Signed)
Called patient with instructions and she understands and will call pharmacy

## 2021-04-15 NOTE — Telephone Encounter (Signed)
Called patient with instructions. Patient will come in Monday for lab work.

## 2021-04-18 ENCOUNTER — Encounter: Payer: Self-pay | Admitting: Obstetrics & Gynecology

## 2021-04-18 DIAGNOSIS — Z780 Asymptomatic menopausal state: Secondary | ICD-10-CM | POA: Insufficient documentation

## 2021-04-19 ENCOUNTER — Telehealth: Payer: Self-pay | Admitting: *Deleted

## 2021-04-19 NOTE — Telephone Encounter (Signed)
A copy of BMD and mammogram mailed to pt's home address since she does not have my chart.  A Ca worksheet also mailed with instructions.

## 2021-04-20 ENCOUNTER — Ambulatory Visit: Payer: Medicare PPO | Attending: Family Medicine

## 2021-04-20 ENCOUNTER — Other Ambulatory Visit: Payer: Self-pay

## 2021-04-20 DIAGNOSIS — E039 Hypothyroidism, unspecified: Secondary | ICD-10-CM | POA: Insufficient documentation

## 2021-04-20 LAB — THYROID STIMULATING HORMONE (SENSITIVE TSH): TSH: 0.181 u[IU]/mL — ABNORMAL LOW (ref 0.430–3.550)

## 2021-04-21 ENCOUNTER — Other Ambulatory Visit (INDEPENDENT_AMBULATORY_CARE_PROVIDER_SITE_OTHER): Payer: Self-pay | Admitting: Family Medicine

## 2021-04-21 DIAGNOSIS — E039 Hypothyroidism, unspecified: Secondary | ICD-10-CM

## 2021-04-21 MED ORDER — LEVOTHYROXINE 50 MCG TABLET
50.0000 ug | ORAL_TABLET | Freq: Every morning | ORAL | 2 refills | Status: DC
Start: 2021-04-21 — End: 2021-07-05

## 2021-04-21 NOTE — Telephone Encounter (Signed)
Reviewed result  We check Thyroid lab levels for this reason to make appropriate adjustment.  If patient has been taking the as prescribed We need to go down to and recheck TSH in 6 weeks  Notify and forward script.

## 2021-04-21 NOTE — Telephone Encounter (Signed)
TSH high means he needs more of the medication. TSH low means he need less of the medication.  We increase or decrease or keep the same dose based on the TSH. Decrease dose to and then recheck TSH in 6 weeks. Is this clear? If not set up a tele visit as medication management.    Do not hesitate to contact our office if there are any concerns or questions.

## 2021-04-21 NOTE — Result Encounter Note (Signed)
See other task Re: adjustment and f/u labs for thyroid disease

## 2021-05-10 ENCOUNTER — Other Ambulatory Visit: Payer: Self-pay

## 2021-05-10 ENCOUNTER — Ambulatory Visit (INDEPENDENT_AMBULATORY_CARE_PROVIDER_SITE_OTHER): Payer: Medicare PPO | Admitting: OBSTETRICS/GYNECOLOGY

## 2021-05-10 ENCOUNTER — Encounter (HOSPITAL_BASED_OUTPATIENT_CLINIC_OR_DEPARTMENT_OTHER): Payer: Self-pay | Admitting: OBSTETRICS/GYNECOLOGY

## 2021-05-10 VITALS — BP 143/75 | Ht 63.0 in | Wt 135.0 lb

## 2021-05-10 DIAGNOSIS — Z13820 Encounter for screening for osteoporosis: Secondary | ICD-10-CM

## 2021-05-10 DIAGNOSIS — Z1231 Encounter for screening mammogram for malignant neoplasm of breast: Secondary | ICD-10-CM

## 2021-05-10 DIAGNOSIS — Z01419 Encounter for gynecological examination (general) (routine) without abnormal findings: Secondary | ICD-10-CM

## 2021-05-10 DIAGNOSIS — Z124 Encounter for screening for malignant neoplasm of cervix: Secondary | ICD-10-CM

## 2021-05-10 NOTE — Progress Notes (Signed)
05/10/2021    Katherine Shepard    Chief Complaint   Patient presents with   . Annual Exam       History of Present Illness    Pt presents today for a new pt annual exam. PCP, Dr. Jesusita Shepard, checks screening labs. Family hx of colon cancer, (aunt). Pt states she is unable to get a colonoscopy because she doesn not have a ride home after procedure. Pt encouraged to discuss the Cologuard with PCP. States is feeling well.  No vaginal bleeding.  Is not sexually active.    No bowel or bladder changes/concerns. No s/s vaginitis. No breast problems.  Pt. verb. no other complaints/concerns.         History    Past Medical History:   Diagnosis Date   . Hypertension    . Hypothyroidism           Past Surgical History:   Procedure Laterality Date   . DILATION AND CURETTAGE, DIAGNOSTIC / THERAPEUTIC     . HX TONSILLECTOMY     . HX WISDOM TEETH EXTRACTION     . MOLE REMOVAL       Allergies   Allergen Reactions   . Erythromycin    . Tetracycline      levothyroxine (SYNTHROID) 50 mcg Oral Tablet, Take 1 Tablet (50 mcg total) by mouth Every morning  triamterene-hydroCHLOROthiazide (MAXZIDE-25) 37.5-25 mg Oral Tablet, TAKE 1 TABLET BY MOUTH ONCE A DAY    No facility-administered medications prior to visit.    OB History    No obstetric history on file.         ROS     General: Negative for appetite loss, chills, fever and sweats  Skin: Negative for hives, pruritus and rash  HEENT: Negative for headache and blurred vision  Neck: Negative for neck pain or stiffness  Respiratory: Negative for shortness of breath and cough  Breast: Negative for breast mass and nipple discharge  Cardiovascular: Negative for chest pain or palpitations  Gastrointestinal: Negative for abdominal pain or constipation  Genitourinary: Negative for urinary complaints, vaginal discharge, and UTI     Physical Exam  BP (!) 143/75   Ht 1.6 m (5\' 3" )   Wt 61.2 kg (135 lb)   BMI 23.91 kg/m            Physical Exam   Constitutional: She appears well-developed and  well-nourished.   Neck: No thyromegaly present.   Cardiovascular: Normal rate and regular rhythm.    No murmur heard.  Pulm:    Lungs are clear to auscultation.    Abdomen:   She exhibits no distension. Soft. No tenderness. No hernia.   Breast exam:  chaperone present  Right normal breast;  with no masses, skin changes or nipple discharge.  Left normal breast;  with no masses, skin changes or nipple discharge.    GU:  External Exam:    External exam normal.    Vaginal Exam:    Vagina normal to exam.  No vaginal discharge present.    Cervix is normal to exam.  Pap Smear collected  Uterus:   Uterus is normal to exam.     Contour:  Regular.    Mobility:  Mobile.    Adenexal findings:    No masses and no tenderness.    Rectovaginal exam:  External exam normal.          Assessment  Katherine Shepard was seen today for annual exam.    Diagnoses and  all orders for this visit:    Encounter for routine gynecologic examination in Medicare patient    Cervical cancer screening  -     Refer to [facility]) Ob/Gyn, [facility])    Pap smear for cervical cancer screening  -     CYTOPATHOLOGY, GYN +/- HIGH RISK HPV; Future    Encounter for screening mammogram for malignant neoplasm of breast  -     MAMMO BILATERAL SCREENING-ADDL VIEWS/BREAST US AS REQ BY RAD; Future    Screening for osteoporosis  -     DEXA BONE DENSITOMETRY; Future          Plan    Return in about 2 years (around 05/11/2023) for annual. Will call if pap smear is abnormal. Mammogram ordered. Pt strongly encouraged to have yearly mammograms.     Farrel Demark, FNP,CNM

## 2021-05-14 ENCOUNTER — Other Ambulatory Visit (INDEPENDENT_AMBULATORY_CARE_PROVIDER_SITE_OTHER): Payer: Self-pay | Admitting: Family Medicine

## 2021-05-14 DIAGNOSIS — E039 Hypothyroidism, unspecified: Secondary | ICD-10-CM

## 2021-05-16 NOTE — Telephone Encounter (Signed)
Last scheduled appointment with you was 12/01/2020.  Currently scheduled future appointment is Visit date not found.

## 2021-05-16 NOTE — Telephone Encounter (Signed)
Missed last appt in July.  Need to be seen before refill

## 2021-05-17 ENCOUNTER — Other Ambulatory Visit (HOSPITAL_BASED_OUTPATIENT_CLINIC_OR_DEPARTMENT_OTHER): Payer: Self-pay

## 2021-05-17 ENCOUNTER — Telehealth (INDEPENDENT_AMBULATORY_CARE_PROVIDER_SITE_OTHER): Payer: Self-pay | Admitting: Family Medicine

## 2021-05-17 DIAGNOSIS — Z124 Encounter for screening for malignant neoplasm of cervix: Secondary | ICD-10-CM

## 2021-05-17 NOTE — Telephone Encounter (Signed)
Patient called inquiring About receiving phone call about her medication. Called patient no answer, left message to return call

## 2021-05-17 NOTE — Telephone Encounter (Signed)
Called patient, no answer. Left message to return call.

## 2021-05-17 NOTE — Telephone Encounter (Signed)
above

## 2021-05-17 NOTE — Telephone Encounter (Signed)
Per Dr. Jesusita Oka, patient needs to be seen before refill since she missed the last appt in July. Called to schedule an appointment, no answer. LVM for patient to return call.

## 2021-05-17 NOTE — Telephone Encounter (Signed)
Called patient with instructions. Patient is currently taking 50 of Synthroid. She is going to have her lab drawn on Friday,

## 2021-05-24 ENCOUNTER — Other Ambulatory Visit: Payer: Self-pay

## 2021-05-24 ENCOUNTER — Ambulatory Visit: Payer: Medicare PPO | Attending: Family Medicine

## 2021-05-24 DIAGNOSIS — E039 Hypothyroidism, unspecified: Secondary | ICD-10-CM | POA: Insufficient documentation

## 2021-05-24 LAB — THYROID STIMULATING HORMONE (SENSITIVE TSH): TSH: 3.655 u[IU]/mL — ABNORMAL HIGH (ref 0.430–3.550)

## 2021-05-25 NOTE — Result Encounter Note (Signed)
Patient missed last appt  Needs medication adjustment and needs to be seen soon

## 2021-07-05 ENCOUNTER — Other Ambulatory Visit (INDEPENDENT_AMBULATORY_CARE_PROVIDER_SITE_OTHER): Payer: Self-pay | Admitting: Family Medicine

## 2021-07-05 DIAGNOSIS — E039 Hypothyroidism, unspecified: Secondary | ICD-10-CM

## 2021-07-05 DIAGNOSIS — I10 Essential (primary) hypertension: Secondary | ICD-10-CM

## 2021-07-05 MED ORDER — TRIAMTERENE 37.5 MG-HYDROCHLOROTHIAZIDE 25 MG TABLET
1.0000 | ORAL_TABLET | Freq: Every day | ORAL | 0 refills | Status: AC
Start: 2021-07-05 — End: ?

## 2021-07-05 MED ORDER — LEVOTHYROXINE 50 MCG TABLET
50.0000 ug | ORAL_TABLET | Freq: Every morning | ORAL | 2 refills | Status: DC
Start: 2021-07-05 — End: 2021-10-12

## 2021-07-09 ENCOUNTER — Emergency Department
Admission: EM | Admit: 2021-07-09 | Discharge: 2021-07-10 | Disposition: A | Discharge status: Home / Self Care | Payer: Medicare PPO | Attending: Medical | Admitting: Medical

## 2021-07-09 ENCOUNTER — Other Ambulatory Visit: Payer: Self-pay

## 2021-07-09 ENCOUNTER — Encounter (HOSPITAL_COMMUNITY): Payer: Self-pay

## 2021-07-09 DIAGNOSIS — I447 Left bundle-branch block, unspecified: Secondary | ICD-10-CM | POA: Insufficient documentation

## 2021-07-09 DIAGNOSIS — Z8249 Family history of ischemic heart disease and other diseases of the circulatory system: Secondary | ICD-10-CM

## 2021-07-09 DIAGNOSIS — W19XXXA Unspecified fall, initial encounter: Secondary | ICD-10-CM

## 2021-07-09 DIAGNOSIS — R1110 Vomiting, unspecified: Secondary | ICD-10-CM

## 2021-07-09 DIAGNOSIS — Z87891 Personal history of nicotine dependence: Secondary | ICD-10-CM | POA: Insufficient documentation

## 2021-07-09 DIAGNOSIS — M47814 Spondylosis without myelopathy or radiculopathy, thoracic region: Secondary | ICD-10-CM | POA: Insufficient documentation

## 2021-07-09 DIAGNOSIS — R0781 Pleurodynia: Secondary | ICD-10-CM | POA: Insufficient documentation

## 2021-07-09 DIAGNOSIS — K7689 Other specified diseases of liver: Secondary | ICD-10-CM | POA: Insufficient documentation

## 2021-07-09 DIAGNOSIS — R9431 Abnormal electrocardiogram [ECG] [EKG]: Secondary | ICD-10-CM

## 2021-07-09 DIAGNOSIS — W1830XA Fall on same level, unspecified, initial encounter: Secondary | ICD-10-CM | POA: Insufficient documentation

## 2021-07-09 DIAGNOSIS — R0600 Dyspnea, unspecified: Secondary | ICD-10-CM | POA: Insufficient documentation

## 2021-07-09 LAB — COMPREHENSIVE METABOLIC PANEL, NON-FASTING
ALBUMIN: 4.3 g/dL (ref 3.4–4.8)
ALKALINE PHOSPHATASE: 99 U/L (ref 50–130)
ALT (SGPT): 13 U/L (ref 8–22)
ANION GAP: 10 mmol/L (ref 4–13)
AST (SGOT): 22 U/L (ref 8–45)
BILIRUBIN TOTAL: 0.3 mg/dL (ref 0.3–1.3)
BUN/CREA RATIO: 14 (ref 6–22)
BUN: 11 mg/dL (ref 8–25)
CALCIUM: 9.5 mg/dL (ref 8.8–10.2)
CHLORIDE: 97 mmol/L (ref 96–111)
CO2 TOTAL: 28 mmol/L (ref 23–31)
CREATININE: 0.81 mg/dL (ref 0.60–1.05)
ESTIMATED GFR: 81 mL/min/BSA (ref >=60)
GLUCOSE: 75 mg/dL (ref 65–125)
POTASSIUM: 3.4 mmol/L — ABNORMAL LOW (ref 3.5–5.1)
PROTEIN TOTAL: 8 g/dL (ref 6.0–8.0)
SODIUM: 135 mmol/L — ABNORMAL LOW (ref 136–145)

## 2021-07-09 LAB — CBC WITH DIFF
BASOPHIL #: <0.1 10*3/uL (ref <=0.20)
BASOPHIL %: 0 %
EOSINOPHIL #: <0.1 10*3/uL (ref <=0.50)
EOSINOPHIL %: 1 %
HCT: 42.4 % (ref 34.8–46.0)
HGB: 14.1 g/dL (ref 11.5–16.0)
IMMATURE GRANULOCYTE #: <0.1 10*3/uL (ref <0.10)
IMMATURE GRANULOCYTE %: 1 % (ref 0–1)
LYMPHOCYTE #: 1.37 10*3/uL (ref 1.00–4.80)
LYMPHOCYTE %: 12 %
MCH: 30.8 pg (ref 26.0–32.0)
MCHC: 33.3 g/dL (ref 31.0–35.5)
MCV: 92.6 fL (ref 78.0–100.0)
MONOCYTE #: 1.01 10*3/uL (ref 0.20–1.10)
MONOCYTE %: 9 %
MPV: 9.1 fL (ref 8.7–12.5)
NEUTROPHIL #: 9.02 10*3/uL — ABNORMAL HIGH (ref 1.50–7.70)
NEUTROPHIL %: 77 %
PLATELETS: 493 10*3/uL — ABNORMAL HIGH (ref 150–400)
RBC: 4.58 10*6/uL (ref 3.85–5.22)
RDW-CV: 14 % (ref 11.5–15.5)
WBC: 11.6 10*3/uL — ABNORMAL HIGH (ref 3.7–11.0)

## 2021-07-09 LAB — TROPONIN-I: TROPONIN I: <7 ng/L (ref <=30)

## 2021-07-09 LAB — PT/INR
INR: 0.97 (ref <=5.00)
PROTHROMBIN TIME: 11.5 seconds (ref 9.7–13.6)

## 2021-07-09 LAB — PTT (PARTIAL THROMBOPLASTIN TIME): APTT: 35.9 seconds (ref 26.0–39.0)

## 2021-07-09 MED ORDER — MORPHINE 4 MG/ML INTRAVENOUS SOLUTION
4.0000 mg | INTRAVENOUS | Status: AC
Start: 2021-07-09 — End: 2021-07-10
  Administered 2021-07-10: 4 mg via INTRAVENOUS
  Filled 2021-07-09: qty 1

## 2021-07-09 NOTE — ED Provider Notes (Signed)
Emergency Department Provider Note  HPI - 07/09/2021    COVID-19 PANDEMIC IN EFFECT    History of Present Illness      Katherine Shepard 65 y.o. female  Date of Birth: 01-May-1956     Attending: Dr. Jimmie Molly    PCP: Velta Addison, MD      Chief Complaint   Patient presents with    Rib Pain    Difficulty Breathing    Fall       Initial ED provider evaluation performed at 2040    History Provided by: Patient      HPI:  Katherine Shepard is a 65 y.o. female who presents to the ED today for left rib pain. Patient arrives to the ED via private transportation. Pt reports that she had a mechanical fall today and landed on her left side. She denies any LOC, head injury or being on blood thinners. Pt complains of left rib pain that worsens with any movement and is painful when breathing She also reports that she had one episode of vomiting a few hours after the incident. Patient denies fever, Please see nurses note for complete medical/surgical history and allergy list. Pt has no other concerns or complaints at time of initial ED provider evaluation.    Review of Systems     Review of Systems:  Constitutional: No fever or chills. No malaise/fatigue.  Skin: No rashes or itching.  HENT: No head injury. No sore throat, ear pain, ear discharge, or difficulty swallowing.  Eyes: No vision changes, redness, or eye discharge.  Cardiovascular: No chest pain or palpitations. No leg swelling.  Respiratory: No cough or SOB.  GI/abdomen: Vomiting. No abdominal pain. No nausea. No diarrhea or constipation.  GU:  No dysuria or hematuria. No decreased urine output.  MSK/Extremities: Left rib pain. No neck or back pain. No extremity pain.   Neuro: No headache. No dizziness, speech change or LOC.   Psych: No SI, HI or substance abuse.  All other systems reviewed and are negative, unless commented on in the HPI.      Past Medical History     Filed Vitals:    07/10/21 0200 07/10/21 0230 07/10/21 0315 07/10/21 0325   BP: (!) 143/66 (!) 141/67 (!)  155/84    Pulse: 65 66  63   Resp: 13 15  15    Temp:    36.5 C (97.7 F)   SpO2: 94% 95%         PMHx:    Medical History     Diagnosis Date Comment Source    Hypertension       Hypothyroidism            Allergies:    Allergies   Allergen Reactions    Erythromycin     Tetracycline        Social History  Social History     Tobacco Use    Smoking status: Former     Types: Cigarettes    Smokeless tobacco: Never   Vaping Use    Vaping Use: Never used   Substance Use Topics    Alcohol use: Not Currently    Drug use: Never       Family History  Family Medical History:     Problem Relation (Age of Onset)    Heart Attack Father    Hypertension (High Blood Pressure) Mother, Father, Maternal Grandmother, Maternal Grandfather    Thyroid Disease Mother, Father, Brother  Home Meds:   No current facility-administered medications for this encounter.    Current Outpatient Medications:     acetaminophen (TYLENOL) 500 mg Oral Tablet, Take 1 Tablet (500 mg total) by mouth Every 4 hours as needed for Pain for up to 5 days, Disp: 15 Tablet, Rfl: 0    levothyroxine (SYNTHROID) 50 mcg Oral Tablet, Take 1 Tablet (50 mcg total) by mouth Every morning, Disp: 30 Tablet, Rfl: 2    methocarbamoL (ROBAXIN) 500 mg Oral Tablet, Take 1 Tablet (500 mg total) by mouth Three times a day as needed for up to 7 days, Disp: 15 Tablet, Rfl: 0    naproxen (NAPROSYN) 500 mg Oral Tablet, Take 1 Tablet (500 mg total) by mouth Twice daily with food for 7 days, Disp: 14 Tablet, Rfl: 0    triamterene-hydroCHLOROthiazide (MAXZIDE-25) 37.5-25 mg Oral Tablet, Take 1 Tablet by mouth Once a day, Disp: 90 Tablet, Rfl: 0          Exam and Objective Findings     Physical Exam  Nursing note and vitals reviewed.  Vital signs reviewed as above.     Constitutional: Pt is awake, alert, and in no acute distress.   Skin: Warm and dry. No rash or lesions.  HENT: Atraumatic. Moist mucous membranes.  Eyes: Conjunctivae are normal. Pupils are equal, round, and  reactive to light.  Cardiovascular: Regular rate. Normal rhythm. Distal pulses intact bilaterally. No edema in legs.  Respiratory: Breath sounds normal. Effort normal. No respiratory distress.   GI/abdomen: Tenderness to left upper quadrant. Abdomen is soft, nondistended. Bowel sounds are normal. No rebound, guarding, or masses.   MSK/Extremities: Tenderness to left lower ribs. Normal range of motion. No erythema. 5/5 strength throughout.  Neurological: Patient is alert and oriented to person, place and time.  Psychiatric: Patient has a normal mood and affect.     Orders     Work-up:  Orders Placed This Encounter    CT CHEST W IV CONTRAST    CT ABDOMEN PELVIS W IV CONTRAST    TROPONIN-I (SERIAL TROPONINS)    CBC/DIFF    COMPREHENSIVE METABOLIC PANEL, NON-FASTING    PT/INR    PTT (PARTIAL THROMBOPLASTIN TIME)    CBC WITH DIFF    ECG 12 LEAD- TIME WITH TROPONINS    morphine 4 mg/mL injection    iopamidol (ISOVUE-370) 76% infusion    naproxen (NAPROSYN) 500 mg Oral Tablet    acetaminophen (TYLENOL) 500 mg Oral Tablet    methocarbamoL (ROBAXIN) 500 mg Oral Tablet        Labs     Labs:   Results for orders placed or performed during the hospital encounter of 07/09/21 (from the past 24 hour(s))   TROPONIN-I (SERIAL TROPONINS)   Result Value Ref Range    TROPONIN I <7 <=30 ng/L   TROPONIN-I (SERIAL TROPONINS)   Result Value Ref Range    TROPONIN I <7 <=30 ng/L   CBC/DIFF    Narrative    The following orders were created for panel order CBC/DIFF.  Procedure                               Abnormality         Status                     ---------                               -----------         ------  CBC WITH [id number]                Abnormal            Final result                 Please view results for these tests on the individual orders.   COMPREHENSIVE METABOLIC PANEL, NON-FASTING   Result Value Ref Range    SODIUM 135 (L) 136 - 145 mmol/L    POTASSIUM 3.4 (L) 3.5 - 5.1 mmol/L     CHLORIDE 97 96 - 111 mmol/L    CO2 TOTAL 28 23 - 31 mmol/L    ANION GAP 10 4 - 13 mmol/L    BUN 11 8 - 25 mg/dL    CREATININE 0.81 0.60 - 1.05 mg/dL    BUN/CREA RATIO 14 6 - 22    ESTIMATED GFR 81 >=60 mL/min/BSA    ALBUMIN 4.3 3.4 - 4.8 g/dL     CALCIUM 9.5 8.8 - 10.2 mg/dL    GLUCOSE 75 65 - 125 mg/dL    ALKALINE PHOSPHATASE 99 50 - 130 U/L    ALT (SGPT) 13 8 - 22 U/L    AST (SGOT)  22 8 - 45 U/L    BILIRUBIN TOTAL 0.3 0.3 - 1.3 mg/dL    PROTEIN TOTAL 8.0 6.0 - 8.0 g/dL    Narrative    Hemolysis can alter results at this level (slight).  Hemolysis can alter results at this level (slight).   PT/INR   Result Value Ref Range    PROTHROMBIN TIME 11.5 9.7 - 13.6 seconds    INR 0.97 <=5.00   PTT (PARTIAL THROMBOPLASTIN TIME)   Result Value Ref Range    APTT 35.9 26.0 - 39.0 seconds   CBC WITH DIFF   Result Value Ref Range    WBC 11.6 (H) 3.7 - 11.0 x103/uL    RBC 4.58 3.85 - 5.22 x106/uL    HGB 14.1 11.5 - 16.0 g/dL    HCT 42.4 34.8 - 46.0 %    MCV 92.6 78.0 - 100.0 fL    MCH 30.8 26.0 - 32.0 pg    MCHC 33.3 31.0 - 35.5 g/dL    RDW-CV 14.0 11.5 - 15.5 %    PLATELETS 493 (H) 150 - 400 x103/uL    MPV 9.1 8.7 - 12.5 fL    NEUTROPHIL % 77 %    LYMPHOCYTE % 12 %    MONOCYTE % 9 %    EOSINOPHIL % 1 %    BASOPHIL % 0 %    NEUTROPHIL # 9.02 (H) 1.50 - 7.70 x103/uL    LYMPHOCYTE # 1.37 1.00 - 4.80 x103/uL    MONOCYTE # 1.01 0.20 - 1.10 x103/uL    EOSINOPHIL # <0.10 <=0.50 x103/uL    BASOPHIL # <0.10 <=0.20 x103/uL    IMMATURE GRANULOCYTE % 1 0 - 1 %    IMMATURE GRANULOCYTE # <0.10 <0.10 x103/uL   POC ISTAT CREATININE (RESULT)   Result Value Ref Range    CREATININE, POC 0.80 0.50 - 1.20 mg/dL       Abnormal Lab results:  Labs Reviewed   COMPREHENSIVE METABOLIC PANEL, NON-FASTING - Abnormal; Notable for the following components:       Result Value    SODIUM 135 (*)     POTASSIUM 3.4 (*)     All other components within normal limits    Narrative:     Hemolysis can alter results at  this level (slight).  Hemolysis can alter  results at this level (slight).   CBC WITH DIFF - Abnormal; Notable for the following components:    WBC 11.6 (*)     PLATELETS 493 (*)     NEUTROPHIL # 9.02 (*)     All other components within normal limits   TROPONIN-I - Normal   TROPONIN-I - Normal   PT/INR - Normal   PTT (PARTIAL THROMBOPLASTIN TIME) - Normal   POC ISTAT CREATININE (RESULT) - Normal   CBC/DIFF    Narrative:     The following orders were created for panel order CBC/DIFF.  Procedure                               Abnormality         Status                     ---------                               -----------         ------                     CBC WITH [id number]                Abnormal            Final result                 Please view results for these tests on the individual orders.   TROPONIN-I       Imaging     Imaging:   Results for orders placed or performed during the hospital encounter of 07/09/21 (from the past 72 hour(s))   CT CHEST W IV CONTRAST     Status: None    Narrative    PROCEDURE INFORMATION:   Exam: CT Chest With Contrast; Diagnostic   Exam date and time: 07/10/2021 1:23 AM   Age: 65 years old   Clinical indication: Injury or trauma; Fall; Blunt trauma (contusions or   hematomas); Additional info: Fall trauma left lower chest and left upper   abdomen     TECHNIQUE:   Imaging protocol: Diagnostic computed tomography of the chest with contrast.   Radiation optimization: All CT scans at this facility use at least one of these   dose optimization techniques: automated exposure control; mA and/or kV   adjustment per patient size (includes targeted exams where dose is matched to   clinical indication); or iterative reconstruction.   Contrast material: ISOVUE 370; Contrast volume: 100 ml; Contrast route:   INTRAVENOUS (IV);      COMPARISON:   No relevant prior studies available.     FINDINGS:   Lungs: Unremarkable. No consolidation. No masses.   Pleural spaces: Unremarkable. No pneumothorax. No pleural effusion.   Heart:  Unremarkable. No cardiomegaly. No pericardial effusion.   Lymph nodes: Unremarkable. No enlarged lymph nodes.   Vasculature: Unremarkable. No aortic aneurysm.      Bones/joints: Degenerative disc space narrowing is present throughout the   thoracic spine producing kyphosis. There is mild compression of the superior   endplate of the QA348G vertebral body which is old in nature.   Soft tissues: Unremarkable.       Impression    1. No evidence of lung contusion/pleural fluid/pneumothorax  2. No rib fractures  3. Degenerative changes throughout the thoracic spine with an old compression   deformity involving the T10 vertebral body     THIS DOCUMENT HAS BEEN ELECTRONICALLY SIGNED BY Denzil Hughes, MD   CT ABDOMEN PELVIS W IV CONTRAST     Status: None    Narrative    PROCEDURE INFORMATION:   Exam: CT Abdomen And Pelvis With Contrast   Exam date and time: 07/10/2021 1:23 AM   Age: 65 years old   Clinical indication: Injury or trauma; Fall; Blunt; Additional info: Luq pain     TECHNIQUE:   Imaging protocol: Computed tomography of the abdomen and pelvis with contrast.   Radiation optimization: All CT scans at this facility use at least one of these   dose optimization techniques: automated exposure control; mA and/or kV   adjustment per patient size (includes targeted exams where dose is matched to   clinical indication); or iterative reconstruction.   Contrast material: ISOVUE 370; Contrast volume: 100 ml; Contrast route:   INTRAVENOUS (IV);      COMPARISON:   No relevant prior studies available.     FINDINGS:   Lungs: No active parenchymal changes. No nodular densities.     Liver: Multiple cysts are present throughout the liver. No free fluid or air in   the abdomen or pelvis was identified.   Gallbladder and bile ducts: Normal. No calcified stones. No ductal dilation.   Pancreas: Normal. No ductal dilation.   Spleen: Normal. No splenomegaly.   Adrenal glands: Normal. No mass.   Kidneys and ureters: Normal. No hydronephrosis.    Stomach and bowel: Unremarkable. No obstruction. No mucosal thickening.   Appendix: No evidence of appendicitis.     Intraperitoneal space: See "Liver" finding.   Vasculature: Unremarkable. No abdominal aortic aneurysm.   Lymph nodes: Unremarkable. No enlarged lymph nodes.   Urinary bladder: The urinary bladder is markedly distended.   Reproductive: Unremarkable as visualized.   Bones/joints: Degenerative disc space narrowing is most pronounced at the L4-L5   level where there is also a grade 1 spondylolisthesis. There is again evidence   of mild compression deformity of the superior endplate of 624THL which is most   likely old in nature. No pelvic fractures were identified.   Soft tissues: Unremarkable.       Impression    1. No evidence of liver/splenic/renal laceration  2. No free fluid or air in the abdomen or pelvis  3. No pelvic fracture  4. No acute compression fractures of the axial spine with the exception of an   old compression fracture T12  5. Multiple hepatic cysts     THIS DOCUMENT HAS BEEN ELECTRONICALLY SIGNED BY Denzil Hughes, MD       EKG's      Most Recent EKG This Encounter   ECG 12 LEAD- TIME WITH TROPONINS    Collection Time: 07/10/21 12:36 AM   Result Value    Ventricular rate 61    Atrial Rate 61    PR Interval 168    QRS Duration 126    QT Interval 482    QTC Calculation 485    Calculated P Axis 25    Calculated R Axis 11    Calculated T Axis 105    Narrative    Normal sinus rhythm  Left bundle branch block  Abnormal ECG     Assessment & Plan     Plan: Appropriate testing. Medical Records reviewed.  MDM:    During the patient's stay in the emergency department, the above listed testing was performed to assist with medical decision making, and were reviewed by myself when available for review.    Pt remained stable throughout the emergency department course.           Disposition     Impression:   Diagnosis     Diagnosis Comment Added By Time Added    Gearlean Alf, DO 07/10/2021   1:55 AM        Disposition:  Discharged     Discussed results, diagnosis, and treatment with the patient.   Medication instructions were discussed with the patient.   It was advised that the patient return to the ED with any new, concerning, or worsening symptoms, and follow up as directed.    The patient verbalized understanding of all instructions and had no further questions or concerns.     Follow up:   Dorothyann Peng, MD  [address]  [place] [address]  [phone]    In 2 days        Prescriptions     Prescriptions:   Discharge Medication List as of 07/10/2021  1:56 AM      START taking these medications    Details   acetaminophen (TYLENOL) 500 mg Oral Tablet Take 1 Tablet (500 mg total) by mouth Every 4 hours as needed for Pain for up to 5 days, Disp-15 Tablet, R-0, Print      methocarbamoL (ROBAXIN) 500 mg Oral Tablet Take 1 Tablet (500 mg total) by mouth Three times a day as needed for up to 7 days, Disp-15 Tablet, R-0, Print      naproxen (NAPROSYN) 500 mg Oral Tablet Take 1 Tablet (500 mg total) by mouth Twice daily with food for 7 days, Disp-14 Tablet, R-0, Print             Attestations     I am scribing for, and in the presence of, Dr. Merceda Elks for services provided on 07/09/2021.  French Ana, SCRIBE    [place], SCRIBE 07/09/2021, 22:57      I personally performed the services described in this documentation, as scribed in my presence, and it is both accurate and complete   Merceda Elks, DO   Emergency Medicine       This note may have been partially generated using MModal Fluency Direct system, and there may be some incorrect words, spellings, and punctuation that were not noted in checking the chart before saving.

## 2021-07-09 NOTE — ED Triage Notes (Addendum)
REPORTS FELL WHILE WALKING. LANDED ON LEFT SIDE. NO HIT HEAD. HURTS TO BREATHE. NO LOC. NO BLOOD THINNERS.

## 2021-07-09 NOTE — ED Nurses Note (Addendum)
AAOx4. C/o 5/10 dull left chest pain after falling Thursday. Pain worse with inspiration and movement of left arm. Bruising noted to left palm of hand. Denies hitting head or LOC. Monitors applied. Rr even and unlabored. Awaiting results.

## 2021-07-10 ENCOUNTER — Emergency Department (HOSPITAL_COMMUNITY): Payer: Medicare PPO

## 2021-07-10 LAB — ECG 12 LEAD
Atrial Rate: 61 {beats}/min
Atrial Rate: 65 {beats}/min
Calculated P Axis: 15 degrees
Calculated P Axis: 25 degrees
Calculated R Axis: 11 degrees
Calculated T Axis: 105 degrees
Calculated T Axis: 141 degrees
PR Interval: 160 ms
PR Interval: 168 ms
QRS Duration: 126 ms
QRS Duration: 128 ms
QT Interval: 440 ms
QT Interval: 482 ms
QTC Calculation: 457 ms
QTC Calculation: 485 ms
Ventricular rate: 61 {beats}/min
Ventricular rate: 65 {beats}/min

## 2021-07-10 LAB — TROPONIN-I: TROPONIN I: <7 ng/L (ref <=30)

## 2021-07-10 LAB — POC ISTAT CREATININE (RESULT): CREATININE, POC: 0.8 mg/dL (ref 0.50–1.20)

## 2021-07-10 MED ORDER — IOPAMIDOL 370 MG IODINE/ML (76 %) INTRAVENOUS SOLUTION
100.0000 mL | INTRAVENOUS | Status: AC
Start: 2021-07-10 — End: 2021-07-10
  Administered 2021-07-10: 02:00:00 100 mL via INTRAVENOUS

## 2021-07-10 MED ORDER — METHOCARBAMOL 500 MG TABLET
500.0000 mg | ORAL_TABLET | Freq: Three times a day (TID) | ORAL | 0 refills | Status: AC | PRN
Start: 2021-07-10 — End: 2021-07-17

## 2021-07-10 MED ORDER — NAPROXEN 500 MG TABLET
500.0000 mg | ORAL_TABLET | Freq: Two times a day (BID) | ORAL | 0 refills | Status: AC
Start: 2021-07-10 — End: 2021-07-17

## 2021-07-10 MED ORDER — ACETAMINOPHEN 500 MG TABLET
500.0000 mg | ORAL_TABLET | ORAL | 0 refills | Status: AC | PRN
Start: 2021-07-10 — End: 2021-07-15

## 2021-07-10 NOTE — ED Nurses Note (Signed)
DC instructions and prescriptions provided with voiced understanding. Abcs intact. Ambulatory to exit with son

## 2021-07-29 ENCOUNTER — Ambulatory Visit (HOSPITAL_BASED_OUTPATIENT_CLINIC_OR_DEPARTMENT_OTHER): Payer: Medicare PPO

## 2021-07-29 ENCOUNTER — Other Ambulatory Visit (HOSPITAL_BASED_OUTPATIENT_CLINIC_OR_DEPARTMENT_OTHER): Payer: Medicare PPO | Admitting: Radiology

## 2021-08-01 ENCOUNTER — Telehealth (INDEPENDENT_AMBULATORY_CARE_PROVIDER_SITE_OTHER): Payer: Self-pay | Admitting: Family Medicine

## 2021-08-01 NOTE — Telephone Encounter (Signed)
Per Dr. Clayborne Artist request called patient to check on BP management and to go over the comprehensive health assessment flowsheet.     No answer. LVM to return call.

## 2021-08-01 NOTE — Telephone Encounter (Signed)
Patient returned call.    Comprehensive assessment form completed.    States her average BP runs between   130/70 - 140/80    Patient's BP yesterday was 136/76.

## 2021-08-01 NOTE — Nursing Note (Signed)
08/01/21 1400   Comprehensive Health Assessment-Adult   Do you wish to complete this form? Yes   During the past 4 weeks, how would you rate your health in general? Good   During the past 4 weeks, how much difficulty have you had doing your usual activities inside and outside your home because of medical or emotional problems? No difficulty at all   During the past 4 weeks, was someone available to help you if you needed and wanted help? Yes, as much as I wanted   In the past year, how many times have you gone to the emergency department or been admitted to a hospital for a health problem? 1 time   Are you generally satisfied with your sleep? No   Do you have enough money to buy things you need in everyday life, such as food, clothing, medicines, and housing? Yes, always   Can you get to places beyond walking distance without help?  (For example, can you drive your own car or travel alone on buses)? Yes   Do you fasten your seatbelt when you are in a car? Yes, usually   Do you exercise 20 minutes 3 or more days per week (such as walking, dancing, biking, mowing grass, swimming)? Yes, most of the time   How often do you eat food that is healthy (fruits, vegetables, lean meats) instead of unhealthy (sweets, fast food, junk food, fatty foods)? Almost always   Have your parents, brothers or sisters had any of the following problems before the age of 69? (check all that apply) Heart problems, or hardening of the arteries;Diabetes (sugar);Cancer;High cholesterol   How often do you have trouble taking medicines the eay you are told to take them? I always take them as prescribed   Do you need any help communicating with your doctors and nurses because of vision or hearing problems? No   During the past 12 months, have you experienced confusion or memory loss that is happening more often or is getting worse? No   Do you have one person you think of as your personal doctor (primary care provider or family doctor)? Yes   If  you are seeing a Primary Care Provider (PCP) or family doctor. please list their name Dr. Jesusita Oka   Are you now also seeing any specialist physician(s) (such as eye doctor, foot doctor, skin doctor)? No   How confident are you that you can control or manage most of your health problems? Very confident

## 2021-08-09 NOTE — Progress Notes (Signed)
[facility] [place]   [address]  [place] [address]  Operated by [facility]  Medicare Annual Wellness Visit    Name: Katherine Shepard MRN:  [id number]   Date: 03/14/2021 Age: 65 y.o.       SUBJECTIVE:   Katherine Shepard is a 65 y.o. female for presenting for Medicare Wellness exam.   I have reviewed and reconciled the medication list with the patient today.    Comprehensive Health Assessment:  Patient verbal responses recorded in flowsheet   Comprehensive Health Assessment-Adult 08/01/2021   Do you wish to complete this form? Yes   During the past 4 weeks, how would you rate your health in general? Good   During the past 4 weeks, how much difficulty have you had doing your usual activities inside and outside your home because of medical or emotional problems? No difficulty at all   During the past 4 weeks, was someone available to help you if you needed and wanted help? Yes, as much as I wanted   In the past year, how many times have you gone to the emergency department or been admitted to a hospital for a health problem? 1 time   Are you generally satisfied with your sleep? No   Do you have enough money to buy things you need in everyday life, such as food, clothing, medicines, and housing? Yes, always   Can you get to places beyond walking distance without help?  (For example, can you drive your own car or travel alone on buses)? Yes   Do you fasten your seatbelt when you are in a car? Yes, usually   Do you exercise 20 minutes 3 or more days per week (such as walking, dancing, biking, mowing grass, swimming)? Yes, most of the time   How often do you eat food that is healthy (fruits, vegetables, lean meats) instead of unhealthy (sweets, fast food, junk food, fatty foods)? Almost always   Have your parents, brothers or sisters had any of the following problems before the age of 44? (check all that apply) Heart problems, or hardening of the arteries;Diabetes (sugar);Cancer;High  cholesterol   How often do you have trouble taking medicines the eay you are told to take them? I always take them as prescribed   Do you need any help communicating with your doctors and nurses because of vision or hearing problems? No   During the past 12 months, have you experienced confusion or memory loss that is happening more often or is getting worse? No   Do you have one person you think of as your personal doctor (primary care provider or family doctor)? Yes   If you are seeing a Primary Care Provider (PCP) or family doctor. please list their name Dr. Jesusita Oka   Are you now also seeing any specialist physician(s) (such as eye doctor, foot doctor, skin doctor)? No   How confident are you that you can control or manage most of your health problems? Very confident       I have reviewed and updated as appropriate the past medical, family and social history. 03/14/2021 as summarized below:  Past Medical History:   Diagnosis Date   . Hypertension    . Hypothyroidism      Past Surgical History:   Procedure Laterality Date   . Dilation and curettage, diagnostic / therapeutic     . Hx tonsillectomy     . Hx wisdom teeth extraction     .  Mole removal       Current Outpatient Medications   Medication Sig   . levothyroxine (SYNTHROID) 50 mcg Oral Tablet Take 1 Tablet (50 mcg total) by mouth Every morning   . triamterene-hydroCHLOROthiazide (MAXZIDE-25) 37.5-25 mg Oral Tablet Take 1 Tablet by mouth Once a day     Family Medical History:     Problem Relation (Age of Onset)    Heart Attack Father    Hypertension (High Blood Pressure) Mother, Father, Maternal Grandmother, Maternal Grandfather    Thyroid Disease Mother, Father, Brother          Social History     Socioeconomic History   . Marital status: Single   Tobacco Use   . Smoking status: Former     Types: Cigarettes   . Smokeless tobacco: Never   Vaping Use   . Vaping Use: Never used   Substance and Sexual Activity   . Alcohol use: Not Currently   . Drug use: Never   .  Sexual activity: Not Currently        List of Current Health Care Providers   Care Team     PCP     Name Type Specialty Phone Number    Dorothyann Peng, MD Physician [facility] [phone]          Care Team     No care team found                  Health Maintenance   Topic Date Due   . Osteoporosis screening  Never done   . Annual Wellness Visit  Never done   . Pneumococcal Vaccination, Age 28+ (1 - PCV) Never done   . Influenza Vaccine (1) 04/28/2021   . Mammography  Never done   . Colonoscopy  Never done   . Depression Screening  03/14/2022   . Pap smear  05/17/2026   . Meningococcal Vaccine  Aged Out   . Adult Tdap-Td  Discontinued   . Hepatitis C screening  Discontinued   . HIV Screening  Discontinued   . Shingles Vaccine  Discontinued   . Covid-19 Vaccine  Discontinued     Medicare Wellness Assessment   Medicare initial or wellness physical in the last year?: No  Advance Directives (optional)   Does patient have a living will or MPOA: no   Has patient provided [employer] with a copy?: no   Advance directive information given to the patient today?: no      Activities of Daily Living   Do you need help with dressing, bathing, or walking?: No   Do you need help with shopping, housekeeping, medications, or finances?: No   Do you have rugs in hallways, broken steps, or poor lighting?: No   Do you have grab bars in your bathroom, non-slip strips in your tub, and hand rails on your stairs?: No   Urinary Incontinence Screen (Women >=65 only)   Do you ever leak urine when you don't want to?: No   Cognitive Function Screen (1=Yes, 0=No)   What is you age?: Correct   What is the time to the nearest hour?: Correct   What is the year?: Correct   What is the name of this clinic?: Correct   Can the patient recognize two persons (the doctor, the nurse, home help, etc.)?: Correct   What is the date of your birth? (day and month sufficient) : Correct   In what year did World War II end?: Correct  Who is the current  president of the [place]?: Correct   Count from 20 down to 1?: Correct   What address did I give you earlier?: Correct   Total Score: 10   Interpretation of Total Score: Greater than 6 Normal   Hearing Screen   Have you noticed any hearing difficulties?: No  After whispering 9-1-6 how many numbers did the patient repeat correctly?: 3   Fall Risk Screen   Do you worry about falling?: No  Have you fallen in the past year?: No   Vision Screen   Right Eye = 20:  (pending)   Left Eye = 20:  (pending)   Depression Screen   Little interest or pleasure in doing things.: Not at all  Feeling down, depressed, or hopeless: Not at all  PHQ 2 Total: 0   Pain Score   Pain Score:   0 - No pain    Substance Use-Abuse Screening   Tobacco Use   In Past 12 MONTHS, how often have you used any tobacco product (for example, cigarettes, e-cigarettes, cigars, pipes, or smokeless tobacco)?: Never   Alcohol use   In the PAST 12 MONTHS, how often have you had 5 (men)/4 (women) or more drinks containing alcohol in one day?: Never   Prescription Drug Use             Illicit Drug Use   In the PAST 12 MONTHS, how often have you used any drugs, including marijuana, cocaine or crack, heroin, methamphetamine, hallucinogens, ecstasy/MDMA?: Never           OBJECTIVE:   There were no vitals taken for this visit.       Other appropriate exam:    Health Maintenance Due   Topic Date Due   . Osteoporosis screening  Never done   . Annual Wellness Visit  Never done   . Pneumococcal Vaccination, Age 33+ (1 - PCV) Never done   . Influenza Vaccine (1) 04/28/2021   . Mammography  Never done   . Colonoscopy  Never done      ASSESSMENT & PLAN:   No diagnosis found.   Identified Risk Factors/ Recommended Actions                 No orders of the defined types were placed in this encounter.         The patient has been educated about risk factors and recommended preventive care. Written Prevention Plan completed/ updated and given to patient (see After Visit  Summary).    Return in about 3 months (around 06/14/2021).    Ionna Avis Jesusita Oka, MD

## 2021-08-09 NOTE — Patient Instructions (Signed)
Medicare Preventive Services  Medicare coverage information Recommendation for YOU   Heart Disease and Diabetes   Lipid profile Every 5 years or more often if at risk for cardiovascular disease  Last Lipid Panel  (Last result in the past 2 years)        Cholesterol   HDL   LDL   Direct LDL   Triglycerides      05/11/20 1402 272   70   177  Comment: <100 mg/dL, Optimal  481-856 mg/dL, Near/Above Optimal  314-970 mg/dL, Borderline High  263-785 mg/dL, High  >=885 mg/dL, Very high     027             Diabetes Screening  yearly for those at risk for diabetes, 2 tests per year for those with prediabetes Last Glucose: 75    Diabetes Self Management Training or Medical Nutrition Therapy  For those with diabetes, up to 10 hrs initial training within a year, subsequent years up to 2 hrs of follow up training Optional for those with diabetes     Medical Nutrition Therapy Three hours of one-on-one counseling in first year, two hours in subsequent years Optional for those with diabetes, kidney disease   Intensive Behavioral Therapy for Obesity  Face-to-face counseling, first month every week, month 2-6 every other week, month 7-12 every month if continued progress is documented Optional for those with Body Mass Index 30 or higher  Your There is no height or weight on file to calculate BMI.   Tobacco Cessation (Quitting) Counseling   Two attempts per year, max 4 sessions per attempt, up to 8 per year, for those with tobacco-related health condition Optional for those that use tobacco   Cancer Screening   Colorectal screening   For anyone age 3 to 61 or any age if high risk:  Screening Colonoscopy every 10 yrs if low risk,  more frequent if higher risk  OR  Cologuard Stool DNA test once every 3 years OR  Fecal Occult Blood Testing yearly OR  Flexible  Sigmoidoscopy  every 5 yr OR  CT Colonography every 5 yrs    See your schedule below   Screening Pap Test Recommended every 3 years for all women age 35 to 68, or every five years  if combined with HPV test (routine screening not needed after total hysterectomy).  Medicare covers every 2 years, up to yearly if high risk.  Screening Pelvic Exam Medicare covers every 2 years, yearly if high risk or childbearing age with abnormal Pap in last 3 yrs. See your schedule below   Screening Mammogram   Recommended every 2 years for women age 47 to 77, or more frequent if you have a higher risk. Selectively recommended for women between 40-49 based on shared decisions about risk. Covered by Medicare up to every year for women age 38 or older See your schedule below   Lung Cancer Screening  Annual low dose computed tomography (LDCT scan) is recommended for those age 40-77 who smoked 20 pack-years and are current smokers or quit smoking within past 15 years (one pack-year= smoking one PPD for one year), after counseling by your doctor or nurse clinician about the possible benefits or harms. See your schedule below   Vaccinations   Pneumococcal Vaccine: Recommended routinely age 86+ with one or two separate vaccines based on your risk    Recommended before age 54 if medical conditions with increased risk  Seasonal Influenza Vaccine: Once every flu season  Hepatitis B Vaccine: 3 doses if risk (including anyone with diabetes or liver disease)  Shingles Vaccine: Two doses at age 31 or older  Diphtheria Tetanus Pertussis Vaccine: ONCE as adult, booster every 10 years     Immunization History   Administered Date(s) Administered    Influenza Vaccine, 6 month-adult 07/05/2005, 06/25/2017, 07/10/2018    ZOSTAVAX (VARICELLA ZOSTER VACCINE) 09/12/2013     Shingles vaccine and Diphtheria Tetanus Pertussis vaccines are available at pharmacies or local health department without a prescription.   Other Screening   Bone Densitometry   Every 24 months for anyone at risk, including postmenopausal       Glaucoma Screening   Yearly if in high risk group such as diabetes, family history, African American age 69+ or Hispanic  American age 30+      Hepatitis C Screening recommended ONCE for those born between 1945-1965, or high risk for HCV infection       HIV Testing recommended routinely at least ONCE, covered every year for age 80 to 32 regardless of risk, and every year for age over 7 who ask for the test or higher risk  Yearly or up to 3 times in pregnancy         Abdominal Aortic Aneurysm Screening Ultrasound   Once between the age of 49-75 with a family history of AAA       Your Personalized Schedule for Preventive Tests     Health Maintenance: Pending and Last Completed         Date Due Completion Date    Osteoporosis screening Never done ---    Annual Wellness Visit Never done ---    Pneumococcal Vaccination, Age 55+ (1 - PCV) Never done ---    Influenza Vaccine (1) 04/28/2021 07/10/2018    Mammography Never done ---    Colonoscopy Never done ---    Depression Screening 03/14/2022 03/14/2021    Pap smear 05/17/2026 05/17/2021

## 2021-10-12 ENCOUNTER — Other Ambulatory Visit (INDEPENDENT_AMBULATORY_CARE_PROVIDER_SITE_OTHER): Payer: Self-pay | Admitting: Family Medicine

## 2021-10-12 DIAGNOSIS — E039 Hypothyroidism, unspecified: Secondary | ICD-10-CM

## 2021-10-19 ENCOUNTER — Telehealth (INDEPENDENT_AMBULATORY_CARE_PROVIDER_SITE_OTHER): Payer: Self-pay | Admitting: Family Medicine

## 2021-10-19 NOTE — Telephone Encounter (Signed)
Patient states she is no longer seeing Dr. Jesusita Oka as PCP. Chart updated.

## 2022-05-24 ENCOUNTER — Telehealth: Payer: Self-pay | Admitting: *Deleted

## 2022-05-24 NOTE — Telephone Encounter (Signed)
Informed patient that her annual will be every 2 years with Medicare.

## 2022-05-31 ENCOUNTER — Other Ambulatory Visit: Payer: Self-pay | Admitting: Obstetrics & Gynecology

## 2022-05-31 DIAGNOSIS — Z1231 Encounter for screening mammogram for malignant neoplasm of breast: Secondary | ICD-10-CM

## 2022-06-07 ENCOUNTER — Ambulatory Visit (INDEPENDENT_AMBULATORY_CARE_PROVIDER_SITE_OTHER): Payer: Medicare Other

## 2022-06-07 DIAGNOSIS — Z1231 Encounter for screening mammogram for malignant neoplasm of breast: Secondary | ICD-10-CM | POA: Diagnosis not present

## 2022-10-10 ENCOUNTER — Other Ambulatory Visit: Payer: Self-pay | Admitting: *Deleted

## 2022-10-10 DIAGNOSIS — L900 Lichen sclerosus et atrophicus: Secondary | ICD-10-CM

## 2022-10-10 MED ORDER — CLOBETASOL PROPIONATE 0.05 % EX OINT: 1.0000 | TOPICAL_OINTMENT | CUTANEOUS | 1 refills | Status: DC

## 2022-10-10 NOTE — Telephone Encounter (Signed)
Pt called for RF on Clobetasol.  She is over due for annual but due to her insurance cannot have before 03/03/23.  She is also having shoulder surgery.  Pt is aware that she cannot continue to get refills until she makes her next appt.

## 2022-10-14 IMAGING — MG MM DIGITAL SCREENING BILAT W/ TOMO AND CAD
8 series · 8 of 24 positions shown · non-contrast
Comparison: Previous exam(s).

CLINICAL DATA: Screening.

EXAM:
DIGITAL SCREENING BILATERAL MAMMOGRAM WITH TOMOSYNTHESIS AND CAD
TECHNIQUE: Bilateral screening digital craniocaudal and mediolateral oblique
mammograms were obtained. Bilateral screening digital breast
tomosynthesis was performed. The images were evaluated with
computer-aided detection.

[R MLO synth-2D]
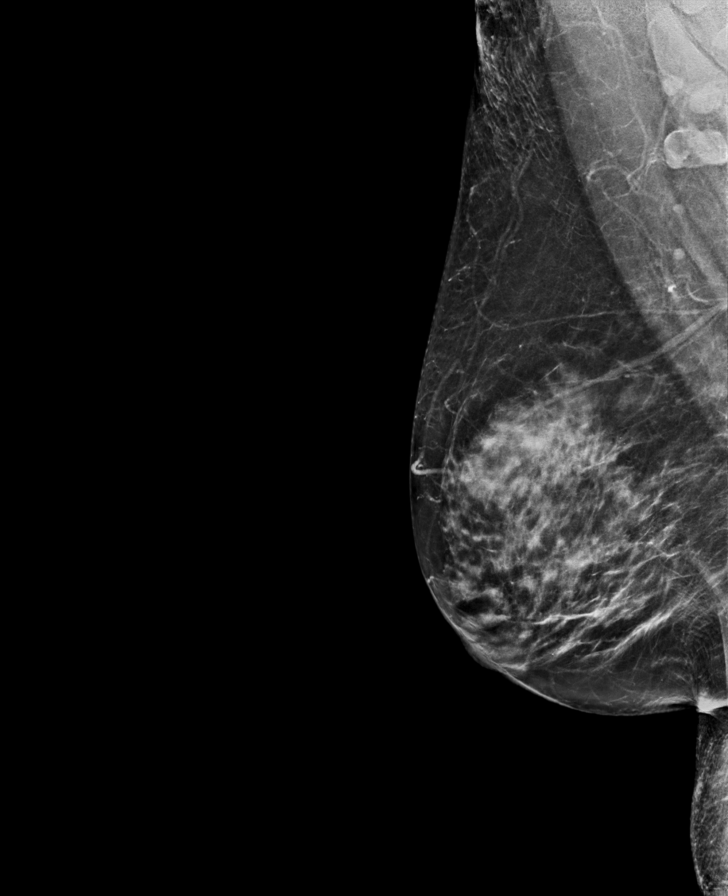

[L CC synth-2D]
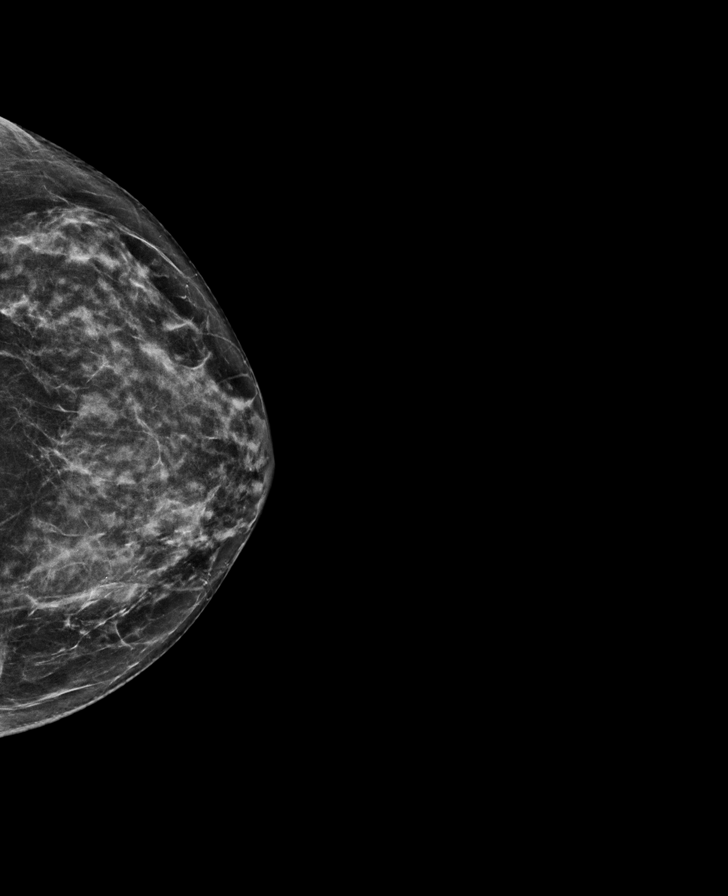

[L MLO synth-2D]
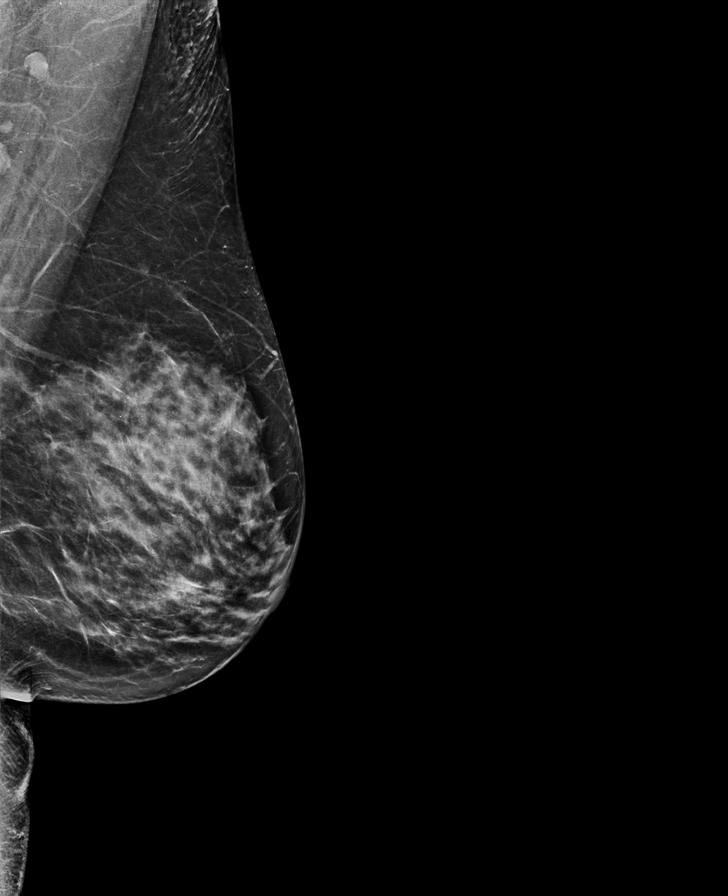

[R CC synth-2D]
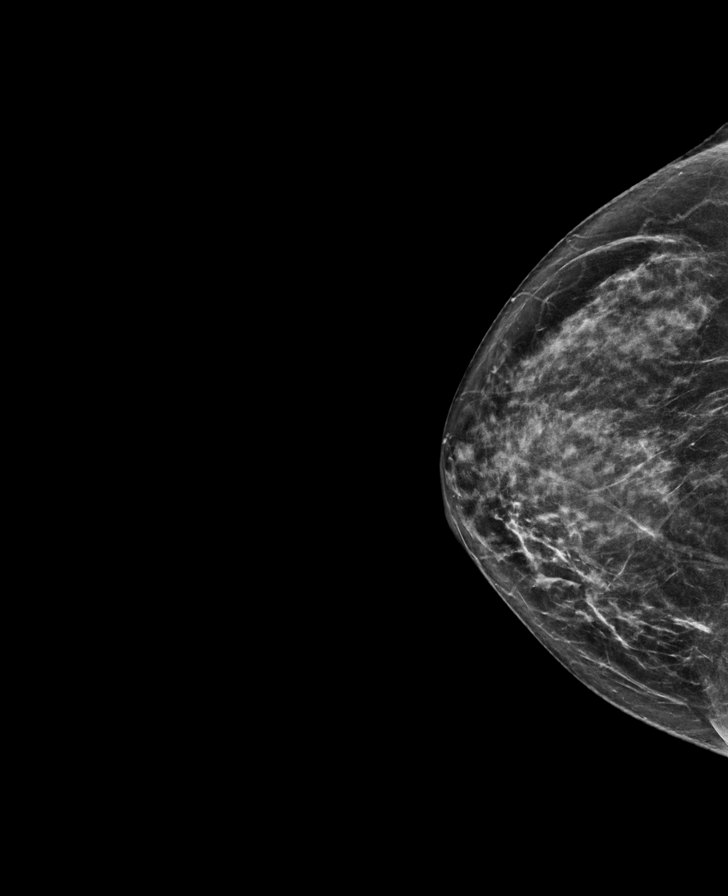

[R CC tomo · tomo slice 34/67.0]
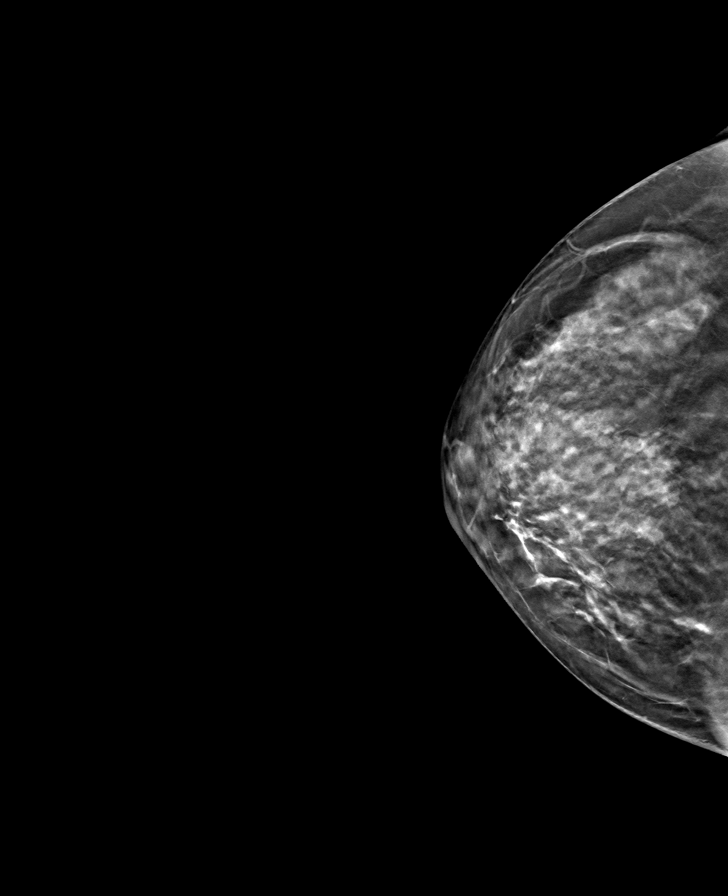

[R MLO tomo · tomo slice 37/73.0]
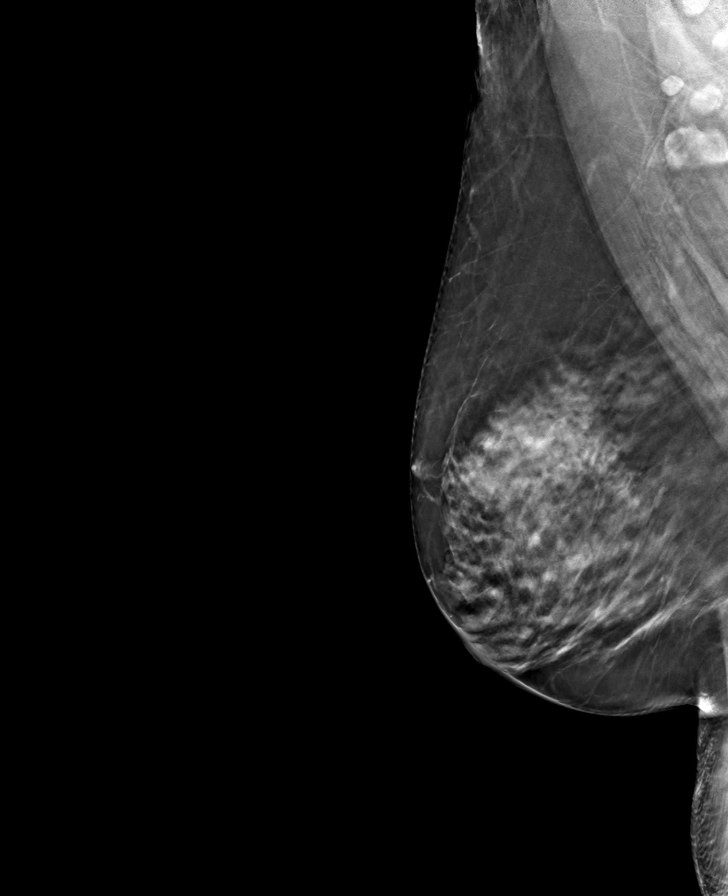

[L CC tomo · tomo slice 31/62.0]
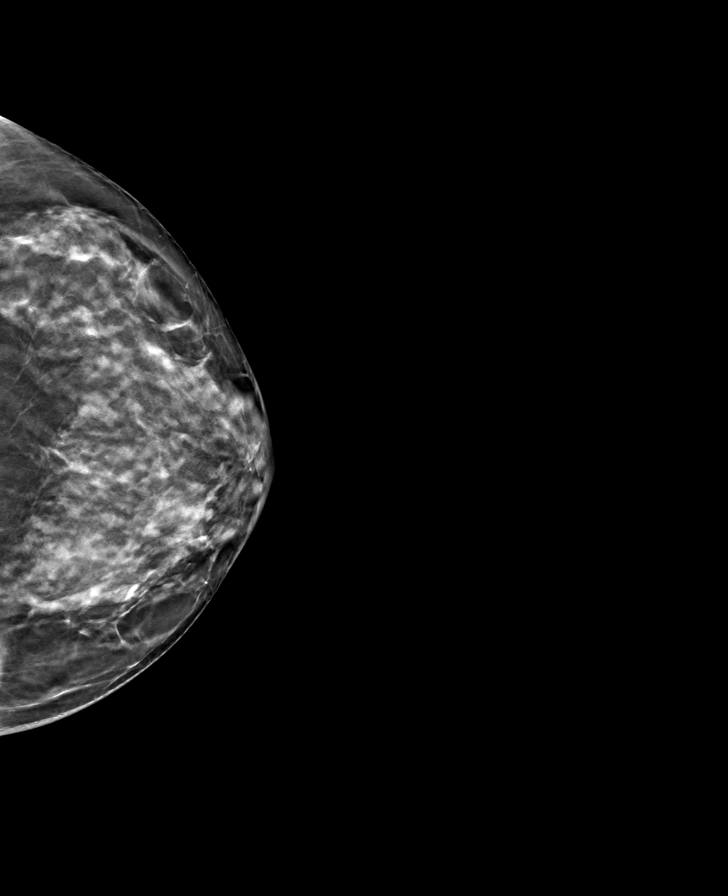

[L MLO tomo · tomo slice 35/69.0]
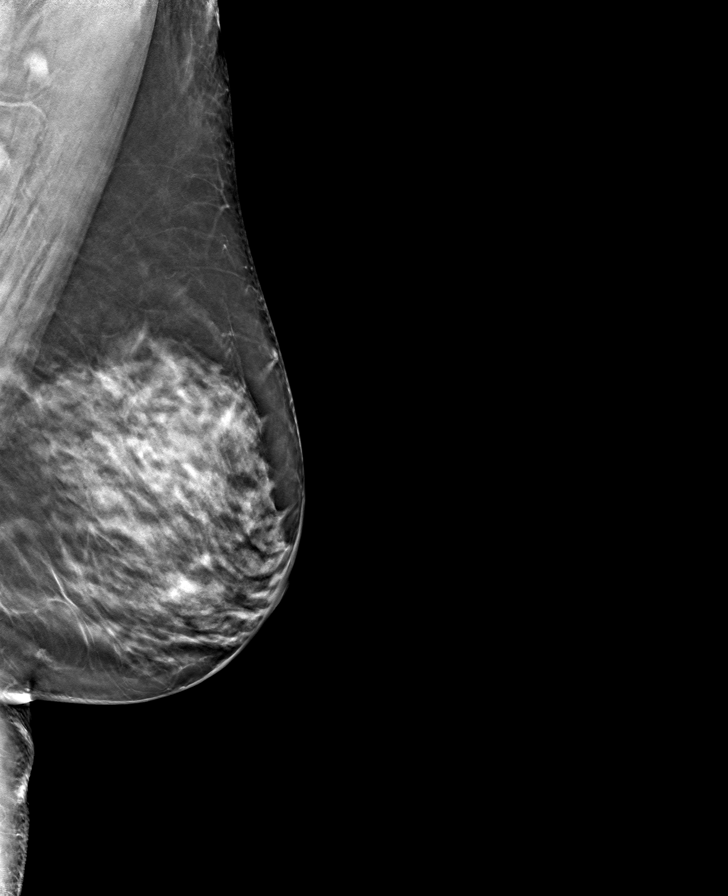

[8 of 24 positions shown; findings below may reference images not displayed]

ACR Breast Density Category c: The breast tissue is heterogeneously
dense, which may obscure small masses.
FINDINGS: There are no findings suspicious for malignancy.
IMPRESSION: No mammographic evidence of malignancy. A result letter of this
screening mammogram will be mailed directly to the patient.

RECOMMENDATION:
Screening mammogram in one year. (Code:Q3-[id number])

BI-RADS CATEGORY  1: Negative.

## 2023-03-19 ENCOUNTER — Other Ambulatory Visit: Payer: Self-pay | Admitting: Obstetrics & Gynecology

## 2023-03-19 ENCOUNTER — Encounter: Payer: Self-pay | Admitting: Obstetrics & Gynecology

## 2023-03-19 ENCOUNTER — Ambulatory Visit (INDEPENDENT_AMBULATORY_CARE_PROVIDER_SITE_OTHER): Payer: Medicare Other | Admitting: Obstetrics & Gynecology

## 2023-03-19 VITALS — BP 159/95 | HR 80 | Ht 62.0 in | Wt 145.0 lb

## 2023-03-19 DIAGNOSIS — M8580 Other specified disorders of bone density and structure, unspecified site: Secondary | ICD-10-CM

## 2023-03-19 DIAGNOSIS — Z780 Asymptomatic menopausal state: Secondary | ICD-10-CM

## 2023-03-19 DIAGNOSIS — N952 Postmenopausal atrophic vaginitis: Secondary | ICD-10-CM | POA: Diagnosis not present

## 2023-03-19 DIAGNOSIS — Z01419 Encounter for gynecological examination (general) (routine) without abnormal findings: Secondary | ICD-10-CM | POA: Diagnosis not present

## 2023-03-19 DIAGNOSIS — R102 Pelvic and perineal pain: Secondary | ICD-10-CM | POA: Diagnosis not present

## 2023-03-19 DIAGNOSIS — L900 Lichen sclerosus et atrophicus: Secondary | ICD-10-CM

## 2023-03-19 MED ORDER — PREMARIN 0.625 MG/GM VA CREA: 0.5000 | TOPICAL_CREAM | Freq: Every day | VAGINAL | 12 refills | Status: DC

## 2023-03-19 MED ORDER — CLOBETASOL PROPIONATE 0.05 % EX OINT
1.0000 | TOPICAL_OINTMENT | CUTANEOUS | 1 refills | Status: DC
Start: 2023-03-19 — End: 2024-06-18

## 2023-03-19 NOTE — Progress Notes (Signed)
   Subjective:     Amanda Schultz is a 67 y.o. female here for a routine exam.  Current complaints: intercourse is painful with tearing and bleeding. Getting 4th shoulder surgery soon.  Got new rescue dog.     Gynecologic History No LMP recorded. Patient is postmenopausal. Contraception: post menopausal status Last Mammogram: 06/07/22- negative Last Pap Smear:  03/02/21- negative Last Colon Screening;  2022 Seat Belts:   yes Sun Screen:   yes Dental Check Up:  yes Brush & Floss:  yes     Obstetric History OB History  Gravida Para Term Preterm AB Living  0 0 0 0 0 0  SAB IAB Ectopic Multiple Live Births  0 0 0 0 0     The following portions of the patient's history were reviewed and updated as appropriate: allergies, current medications, past family history, past medical history, past social history, past surgical history, and problem list.  Review of Systems Pertinent items noted in HPI and remainder of comprehensive ROS otherwise negative.    Objective:     Vitals:   03/19/23 0847  BP: (!) 170/84  Pulse: 87  Weight: 145 lb (65.8 kg)  Height: 5\' 2"  (1.575 m)   Vitals:  WNL General appearance: alert, cooperative and no distress  HEENT: Normocephalic, without obvious abnormality, atraumatic Eyes: negative Throat: lips, mucosa, and tongue normal; teeth and gums normal  Respiratory: Clear to auscultation bilaterally  CV: Regular rate and rhythm  Breasts:  Normal appearance, no masses or tenderness, no nipple retraction or dimpling  GI: Soft, non-tender; bowel sounds normal; no masses,  no organomegaly  GU: External Genitalia:  Tanner V, LS stable  Urethra:  Small caruncle  Vagina: Pale pink, normal rugae, no blood or discharge Hymnal remnants are white and prominent--?atrohpic Increased tone and introitus tor posteriorly with speculum exam   Cervix: No CMT, no lesion, atrophic and almost flat with vagina  Uterus:  Normal size and contour, non tender  Adnexa: Normal,  no masses, non tender  Musculoskeletal: No edema, redness or tenderness in the calves or thighs  Skin: No lesions or rash  Lymphatic: Axillary adenopathy: none     Psychiatric: Normal mood and behavior        Assessment:    Healthy female exam.  LS Osteopenia Dyspareunia   Plan:    Pap up to date Yearly Mammogram Colon screening Osteopenia LS--continue clobetasol Dyspareunia and vaginal tearing--premarin cream (insurance prefers this to intrarosa) and pelvic PT for vaginismus.  Will biopsy if hymenal remnants are still white and prominent after premarin cream  HTN--needs to f/u with PCP.  Will retake after exam.  Pt states it is normal at home.  Pt and husband are wanting to get back to working out and losing weight.

## 2023-03-22 ENCOUNTER — Encounter: Payer: Self-pay | Admitting: Obstetrics & Gynecology

## 2023-03-22 ENCOUNTER — Other Ambulatory Visit: Payer: Self-pay | Admitting: *Deleted

## 2023-03-22 DIAGNOSIS — N942 Vaginismus: Secondary | ICD-10-CM

## 2023-03-22 NOTE — Progress Notes (Signed)
Amb referral placed for Comp Rehab at [facility] for PT Vaginimus per Dr leggett.

## 2023-04-03 ENCOUNTER — Other Ambulatory Visit: Payer: Self-pay | Admitting: Obstetrics & Gynecology

## 2023-04-04 ENCOUNTER — Encounter: Payer: Self-pay | Admitting: Obstetrics & Gynecology

## 2023-04-05 ENCOUNTER — Other Ambulatory Visit: Payer: Self-pay | Admitting: Obstetrics & Gynecology

## 2023-04-05 MED ORDER — INTRAROSA 6.5 MG VA INST: VAGINAL_INSERT | VAGINAL | 12 refills | Status: DC

## 2023-04-05 NOTE — Progress Notes (Signed)
Pt prefers switching back to [place].  Premarin caused side effects.

## 2023-04-09 ENCOUNTER — Encounter: Payer: Self-pay | Admitting: Obstetrics & Gynecology

## 2023-05-21 ENCOUNTER — Ambulatory Visit: Payer: Medicare Other | Admitting: Obstetrics & Gynecology

## 2023-05-21 ENCOUNTER — Encounter: Payer: Self-pay | Admitting: Obstetrics & Gynecology

## 2023-05-21 VITALS — BP 152/81 | HR 75 | Ht 61.0 in | Wt 149.0 lb

## 2023-05-21 DIAGNOSIS — N942 Vaginismus: Secondary | ICD-10-CM | POA: Diagnosis not present

## 2023-05-21 DIAGNOSIS — L900 Lichen sclerosus et atrophicus: Secondary | ICD-10-CM

## 2023-05-21 DIAGNOSIS — N952 Postmenopausal atrophic vaginitis: Secondary | ICD-10-CM

## 2023-05-21 MED ORDER — ALPRAZOLAM 0.5 MG PO TABS: ORAL_TABLET | ORAL | 0 refills | Status: DC

## 2023-06-13 ENCOUNTER — Ambulatory Visit: Payer: Medicare Other

## 2023-06-13 DIAGNOSIS — Z780 Asymptomatic menopausal state: Secondary | ICD-10-CM

## 2023-06-13 DIAGNOSIS — M8580 Other specified disorders of bone density and structure, unspecified site: Secondary | ICD-10-CM

## 2023-06-13 DIAGNOSIS — M8589 Other specified disorders of bone density and structure, multiple sites: Secondary | ICD-10-CM | POA: Diagnosis not present

## 2023-06-15 ENCOUNTER — Encounter: Payer: Self-pay | Admitting: Family Medicine

## 2023-06-15 ENCOUNTER — Other Ambulatory Visit: Payer: Self-pay | Admitting: Family Medicine

## 2023-06-15 DIAGNOSIS — R928 Other abnormal and inconclusive findings on diagnostic imaging of breast: Secondary | ICD-10-CM

## 2023-06-25 ENCOUNTER — Ambulatory Visit: Payer: Medicare Other | Admitting: Obstetrics & Gynecology

## 2023-06-25 ENCOUNTER — Other Ambulatory Visit (HOSPITAL_COMMUNITY)
Admission: RE | Admit: 2023-06-25 | Discharge: 2023-06-25 | Disposition: A | Discharge status: Home / Self Care | Payer: Medicare Other | Source: Ambulatory Visit | Attending: Obstetrics & Gynecology | Admitting: Obstetrics & Gynecology

## 2023-06-25 ENCOUNTER — Encounter: Payer: Self-pay | Admitting: Obstetrics & Gynecology

## 2023-06-25 VITALS — BP 135/83 | HR 81 | Resp 16 | Ht 61.0 in | Wt 152.0 lb

## 2023-06-25 DIAGNOSIS — N9089 Other specified noninflammatory disorders of vulva and perineum: Secondary | ICD-10-CM | POA: Diagnosis not present

## 2023-06-25 DIAGNOSIS — L900 Lichen sclerosus et atrophicus: Secondary | ICD-10-CM | POA: Insufficient documentation

## 2023-06-25 HISTORY — PX: VULVA / PERINEUM BIOPSY: SUR155

## 2023-06-25 MED ORDER — ALPRAZOLAM 0.5 MG PO TABS
ORAL_TABLET | ORAL | 0 refills | Status: DC
Start: 2023-06-25 — End: 2023-08-14

## 2023-06-25 NOTE — Addendum Note (Signed)
Addended by: Granville Lewis on: 06/25/2023 09:22 AM   Modules accepted: Orders

## 2023-06-25 NOTE — Progress Notes (Signed)
   Subjective:    Patient ID: Amanda Schultz, female    DOB: 1955-11-05, 67 y.o.   MRN: [id number]  HPI Amanda Schultz presents for follow up of LS.   Interosa dialy 2 weeks and clobetasol daily for 2 weeks Feeling less burning with bating (using mid soapr Going for mammogram diagnostic Oct 31st.  Stressful--husband getting colonoscopy and cat is sick.   Review of Systems  Constitutional: Negative.   Respiratory: Negative.    Cardiovascular: Negative.   Gastrointestinal: Negative.   Genitourinary: Negative.        Objective:   Physical Exam Vitals reviewed.  Constitutional:      General: Amanda Schultz is not in acute distress.    Appearance: Amanda Schultz is well-developed.  HENT:     Head: Normocephalic and atraumatic.  Eyes:     Conjunctiva/sclera: Conjunctivae normal.  Cardiovascular:     Rate and Rhythm: Normal rate.  Pulmonary:     Effort: Pulmonary effort is normal.  Genitourinary:   Skin:    General: Skin is warm and dry.  Neurological:     Mental Status: Amanda Schultz is alert and oriented to person, place, and time.  Psychiatric:        Mood and Affect: Mood normal.    Vitals:   06/25/23 0747  BP: 135/83  Pulse: 81  Resp: 16  Weight: 152 lb (68.9 kg)  Height: 5\' 1"  (1.549 m)      Assessment & Plan:  67 yo female with LS and hymenal/vulvar ara to be biopsied    Biopsy completed Resum topical medicaitons Thursday Premed (xanax) prior to breast diagnostics Thursday. (Rx sent-1 tab)  VULVAR BIOPSY NOTE  The indications for vulvar biopsy (rule out neoplasia, establish lichen sclerosus diagnosis) were reviewed.   Risks of the biopsy including pain, bleeding, infection, inadequate specimen, scarring and need for additional procedures  were discussed. The patient stated understanding and agreed to undergo procedure today. Consent was signed,  time out performed.  The patient's vulva was prepped with Betadine. 1% lidocaine was injected into hymenal ring. A Tischler biopsy forceps was done,  biopsy tissue was picked up with sterile forceps and sterile scissors were used to excise the lesion.  Small bleeding was noted and hemostasis was achieved using silver nitrate sticks.  The patient tolerated the procedure well. Post-procedure instructions  (pelvic rest for one week) were given to the patient. The patient is to call with heavy bleeding, fever greater than 100.4, foul smelling vaginal discharge or other concerns. Will base next steps on results.

## 2023-06-27 LAB — SURGICAL PATHOLOGY

## 2023-06-28 ENCOUNTER — Ambulatory Visit
Admission: RE | Admit: 2023-06-28 | Discharge: 2023-06-28 | Disposition: A | Discharge status: Home / Self Care | Payer: Medicare Other | Source: Ambulatory Visit | Attending: Family Medicine | Admitting: Family Medicine

## 2023-06-28 ENCOUNTER — Ambulatory Visit: Payer: Medicare Other

## 2023-06-28 DIAGNOSIS — R928 Other abnormal and inconclusive findings on diagnostic imaging of breast: Secondary | ICD-10-CM

## 2023-07-06 ENCOUNTER — Encounter: Payer: Self-pay | Admitting: Obstetrics & Gynecology

## 2023-07-23 ENCOUNTER — Encounter: Payer: Self-pay | Admitting: Obstetrics & Gynecology

## 2023-07-23 ENCOUNTER — Ambulatory Visit: Payer: Medicare Other | Admitting: Obstetrics & Gynecology

## 2023-07-23 VITALS — BP 156/84 | HR 83 | Ht 61.0 in | Wt 152.0 lb

## 2023-07-23 DIAGNOSIS — N901 Moderate vulvar dysplasia: Secondary | ICD-10-CM

## 2023-07-23 NOTE — Progress Notes (Signed)
   Subjective:    Patient ID: Amanda Schultz, female    DOB: February 03, 1956, 67 y.o.   MRN: [id number]  HPI  Amanda Schultz and her husband present to discuss the results of her biopsy.  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  The patient has never had an abnormal Pap smear.  Her [facility] age 36 [facility]  She does have a diagnosis of lichen sclerosus on biopsy here in our office.  She is having reasonable control of her symptoms.  She is using Intrarosa twice a week and clobetasol twice a week.  She is not using vulvar or vaginal moisturizers.  Review of Systems  Constitutional: Negative.   Respiratory: Negative.    Cardiovascular: Negative.   Gastrointestinal: Negative.   Genitourinary: Negative.        No change in vaginal pain.  Symptoms controlled with twice weekly medication as described in HPI.       Objective:   Physical Exam Vitals reviewed.  Constitutional:      General: She is not in acute distress.    Appearance: She is well-developed.  HENT:     Head: Normocephalic and atraumatic.  Eyes:     Conjunctiva/sclera: Conjunctivae normal.  Cardiovascular:     Rate and Rhythm: Normal rate.  Pulmonary:     Effort: Pulmonary effort is normal.  Skin:    General: Skin is warm and dry.  Neurological:     Mental Status: She is alert and oriented to person, place, and time.  Psychiatric:        Mood and Affect: Mood normal.    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  Given the pain with exams we will proceed with vulvar colposcopy and removal of the high-grade lesion that is along the hymenal ring in the operating room. Will schedule the procedure in December.  She has had no problems with anesthesia  and has had multiple shoulder surgeries. Will need to do the procedure in operating room that has a colposcope.  I have reached out to the operating room to confirm that we have the equipment we need. Will also do a Pap smear in the operating room as well as colposcopy of the lower genital tract and removal of the hymenal remnants that was biopsied earlier last month.  HTN--pt is nervous and will f/u with PCP as needed for BP control  32 minutes was spent during this encounter including review of records, review of outside records, coordination of care and counseling with patient and her husband.

## 2023-08-01 ENCOUNTER — Encounter: Payer: Self-pay | Admitting: Obstetrics & Gynecology

## 2023-08-14 ENCOUNTER — Encounter (HOSPITAL_BASED_OUTPATIENT_CLINIC_OR_DEPARTMENT_OTHER): Payer: Self-pay | Admitting: Obstetrics & Gynecology

## 2023-08-14 ENCOUNTER — Other Ambulatory Visit: Payer: Self-pay

## 2023-08-14 NOTE — Progress Notes (Signed)
Spoke w/ via phone for pre-op interview---Amanda Schultz needs dos----none per anesthesia, surgeon orders pending         Schultz results------none COVID test -----patient states asymptomatic no test needed Arrive at -------0730 on Friday, 08/17/2023 NPO after MN NO Solid Food.  Clear liquids from MN until---0630 Med rec completed Medications to take morning of surgery -----Allegra, nasal spray Diabetic medication -----n/a Patient instructed no nail polish to be worn day of surgery Patient instructed to bring photo id and insurance card day of surgery Patient aware to have Driver (ride ) / caregiver    for 24 hours after surgery - husband, Amanda Schultz Patient Special Instructions -----none Pre-Op special Instructions -----Requested orders from Dr. Penne Lash via Epic IB on 08/14/23. Patient verbalized understanding of instructions that were given at this phone interview. Patient denies chest pain, sob, fever, cough at the interview.

## 2023-08-16 NOTE — Anesthesia Preprocedure Evaluation (Addendum)
Anesthesia Evaluation  Patient identified by MRN, date of birth, ID band Patient awake    Reviewed: Allergy & Precautions, NPO status , Patient's Chart, lab work & pertinent test results  History of Anesthesia Complications (+) PONV and history of anesthetic complications  Airway Mallampati: III  TM Distance: >3 FB Neck ROM: Full    Dental  (+) Teeth Intact, Dental Advisory Given   Pulmonary neg pulmonary ROS   Pulmonary exam normal breath sounds clear to auscultation       Cardiovascular negative cardio ROS Normal cardiovascular exam Rhythm:Regular Rate:Normal     Neuro/Psych negative neurological ROS  negative psych ROS   GI/Hepatic negative GI ROS, Neg liver ROS,,,  Endo/Other  negative endocrine ROS    Renal/GU negative Renal ROS  negative genitourinary   Musculoskeletal  (+) Arthritis , Osteoarthritis,    Abdominal  (+) + obese  Peds  Hematology negative hematology ROS (+)   Anesthesia Other Findings   Reproductive/Obstetrics negative OB ROS                             Anesthesia Physical Anesthesia Plan  ASA: 1  Anesthesia Plan: General   Post-op Pain Management: Tylenol PO (pre-op)* and Toradol IV (intra-op)*   Induction: Intravenous  PONV Risk Score and Plan: Ondansetron, Dexamethasone, Midazolam and Treatment may vary due to age or medical condition  Airway Management Planned: LMA  Additional Equipment: None  Intra-op Plan:   Post-operative Plan: Extubation in OR  Informed Consent: I have reviewed the patients History and Physical, chart, labs and discussed the procedure including the risks, benefits and alternatives for the proposed anesthesia with the patient or authorized representative who has indicated his/her understanding and acceptance.     Dental advisory given  Plan Discussed with: CRNA  Anesthesia Plan Comments:        Anesthesia Quick  Evaluation

## 2023-08-17 ENCOUNTER — Ambulatory Visit (HOSPITAL_BASED_OUTPATIENT_CLINIC_OR_DEPARTMENT_OTHER)
Admission: RE | Admit: 2023-08-17 | Discharge: 2023-08-17 | Disposition: A | Discharge status: Home / Self Care | Payer: Medicare Other | Attending: Obstetrics & Gynecology | Admitting: Obstetrics & Gynecology

## 2023-08-17 ENCOUNTER — Ambulatory Visit (HOSPITAL_BASED_OUTPATIENT_CLINIC_OR_DEPARTMENT_OTHER): Payer: Medicare Other | Admitting: Anesthesiology

## 2023-08-17 ENCOUNTER — Other Ambulatory Visit: Payer: Self-pay

## 2023-08-17 ENCOUNTER — Encounter (HOSPITAL_BASED_OUTPATIENT_CLINIC_OR_DEPARTMENT_OTHER)
Admission: RE | Disposition: A | Discharge status: Home / Self Care | Payer: Self-pay | Source: Home / Self Care | Attending: Obstetrics & Gynecology

## 2023-08-17 ENCOUNTER — Encounter (HOSPITAL_BASED_OUTPATIENT_CLINIC_OR_DEPARTMENT_OTHER): Payer: Self-pay | Admitting: Obstetrics & Gynecology

## 2023-08-17 DIAGNOSIS — N891 Moderate vaginal dysplasia: Secondary | ICD-10-CM

## 2023-08-17 DIAGNOSIS — N901 Moderate vulvar dysplasia: Secondary | ICD-10-CM | POA: Insufficient documentation

## 2023-08-17 DIAGNOSIS — Z01818 Encounter for other preprocedural examination: Secondary | ICD-10-CM

## 2023-08-17 DIAGNOSIS — Z780 Asymptomatic menopausal state: Secondary | ICD-10-CM | POA: Diagnosis not present

## 2023-08-17 HISTORY — PX: COLPOSCOPY: SHX161

## 2023-08-17 HISTORY — PX: VULVA /PERINEUM BIOPSY: SHX319

## 2023-08-17 HISTORY — DX: Other specified disorders of bone density and structure, unspecified site: M85.80

## 2023-08-17 HISTORY — DX: Basal cell carcinoma of skin, unspecified: C44.91

## 2023-08-17 HISTORY — DX: Unspecified osteoarthritis, unspecified site: M19.90

## 2023-08-17 HISTORY — DX: Lichen sclerosus et atrophicus: L90.0

## 2023-08-17 HISTORY — DX: Other specified postprocedural states: Z98.890

## 2023-08-17 LAB — CBC
HCT: 42.5 % (ref 36.0–46.0)
Hemoglobin: 14.6 g/dL (ref 12.0–15.0)
MCH: 34 pg (ref 26.0–34.0)
MCHC: 34.4 g/dL (ref 30.0–36.0)
MCV: 98.8 fL (ref 80.0–100.0)
Platelets: 164 10*3/uL (ref 150–400)
RBC: 4.3 MIL/uL (ref 3.87–5.11)
RDW: 13.2 % (ref 11.5–15.5)
WBC: 4.4 10*3/uL (ref 4.0–10.5)
nRBC: 0 % (ref 0.0–0.2)

## 2023-08-17 SURGERY — COLPOSCOPY
Anesthesia: General | Site: Vagina

## 2023-08-17 MED ORDER — LIDOCAINE HCL (CARDIAC) PF 100 MG/5ML IV SOSY
PREFILLED_SYRINGE | INTRAVENOUS | Status: DC | PRN
  Administered 2023-08-17: 60 mg via INTRAVENOUS

## 2023-08-17 MED ORDER — FENTANYL CITRATE (PF) 100 MCG/2ML IJ SOLN
INTRAMUSCULAR | Status: AC
  Filled 2023-08-17: qty 2

## 2023-08-17 MED ORDER — SODIUM CHLORIDE 0.9 % IV SOLN
INTRAVENOUS | Status: DC
  Administered 2023-08-17: 500 mL via INTRAVENOUS

## 2023-08-17 MED ORDER — MIDAZOLAM HCL 2 MG/2ML IJ SOLN
INTRAMUSCULAR | Status: AC
  Filled 2023-08-17: qty 2

## 2023-08-17 MED ORDER — OXYCODONE HCL 5 MG PO TABS
5.0000 mg | ORAL_TABLET | Freq: Once | ORAL | Status: DC | PRN
Start: 2023-08-17 — End: 2023-08-17

## 2023-08-17 MED ORDER — OXYCODONE HCL 5 MG/5ML PO SOLN: 5.0000 mg | Freq: Once | ORAL | Status: DC | PRN

## 2023-08-17 MED ORDER — HYDROCODONE-ACETAMINOPHEN 5-325 MG PO TABS: ORAL_TABLET | ORAL | 0 refills | Status: DC

## 2023-08-17 MED ORDER — MIDAZOLAM HCL 5 MG/5ML IJ SOLN
INTRAMUSCULAR | Status: DC | PRN
  Administered 2023-08-17: 2 mg via INTRAVENOUS

## 2023-08-17 MED ORDER — PROPOFOL 10 MG/ML IV BOLUS
INTRAVENOUS | Status: AC
  Filled 2023-08-17: qty 20

## 2023-08-17 MED ORDER — ONDANSETRON HCL 4 MG/2ML IJ SOLN
4.0000 mg | Freq: Once | INTRAMUSCULAR | Status: DC | PRN
Start: 2023-08-17 — End: 2023-08-17

## 2023-08-17 MED ORDER — FENTANYL CITRATE (PF) 100 MCG/2ML IJ SOLN
INTRAMUSCULAR | Status: DC | PRN
  Administered 2023-08-17 (×4): 50 ug via INTRAVENOUS

## 2023-08-17 MED ORDER — IODINE STRONG (LUGOLS) 5 % PO SOLN
ORAL | Status: DC | PRN
  Administered 2023-08-17: .1 mL

## 2023-08-17 MED ORDER — ACETAMINOPHEN 500 MG PO TABS
1000.0000 mg | ORAL_TABLET | Freq: Once | ORAL | Status: AC
Start: 2023-08-17 — End: 2023-08-17
  Administered 2023-08-17: 1000 mg via ORAL

## 2023-08-17 MED ORDER — KETOROLAC TROMETHAMINE 30 MG/ML IJ SOLN
INTRAMUSCULAR | Status: DC | PRN
  Administered 2023-08-17: 30 mg via INTRAVENOUS

## 2023-08-17 MED ORDER — ACETIC ACID 4% SOLUTION
Status: DC | PRN
  Administered 2023-08-17: 1 via TOPICAL

## 2023-08-17 MED ORDER — AMISULPRIDE (ANTIEMETIC) 5 MG/2ML IV SOLN: 10.0000 mg | Freq: Once | INTRAVENOUS | Status: DC | PRN

## 2023-08-17 MED ORDER — HYDROMORPHONE HCL 1 MG/ML IJ SOLN: 0.2500 mg | INTRAMUSCULAR | Status: DC | PRN

## 2023-08-17 MED ORDER — DEXAMETHASONE SODIUM PHOSPHATE 10 MG/ML IJ SOLN
INTRAMUSCULAR | Status: DC | PRN
  Administered 2023-08-17: 5 mg via INTRAVENOUS

## 2023-08-17 MED ORDER — PROPOFOL 10 MG/ML IV BOLUS
INTRAVENOUS | Status: DC | PRN
  Administered 2023-08-17: 150 mg via INTRAVENOUS

## 2023-08-17 MED ORDER — LIDOCAINE-EPINEPHRINE (PF) 1 %-1:200000 IJ SOLN
INTRAMUSCULAR | Status: DC | PRN
  Administered 2023-08-17: 6 mL

## 2023-08-17 MED ORDER — POVIDONE-IODINE 10 % EX SWAB: 2.0000 | Freq: Once | CUTANEOUS | Status: DC

## 2023-08-17 MED ORDER — LIDOCAINE 5 % EX OINT: 1.0000 | TOPICAL_OINTMENT | CUTANEOUS | 0 refills | Status: DC | PRN

## 2023-08-17 MED ORDER — ONDANSETRON HCL 4 MG/2ML IJ SOLN
INTRAMUSCULAR | Status: DC | PRN
  Administered 2023-08-17: 4 mg via INTRAVENOUS

## 2023-08-17 MED ORDER — LACTATED RINGERS IV SOLN: INTRAVENOUS | Status: DC

## 2023-08-17 MED ORDER — ACETAMINOPHEN 500 MG PO TABS
ORAL_TABLET | ORAL | Status: AC
  Filled 2023-08-17: qty 2

## 2023-08-17 SURGICAL SUPPLY — 19 items
BLADE SURG 15 STRL LF DISP TIS (BLADE) ×2 IMPLANT
DRSG TELFA 3X8 NADH STRL (GAUZE/BANDAGES/DRESSINGS) ×2 IMPLANT
GAUZE 4X4 16PLY ~~LOC~~+RFID DBL (SPONGE) ×2 IMPLANT
GLOVE BIO SURGEON STRL SZ7 (GLOVE) ×2 IMPLANT
GLOVE BIOGEL PI IND STRL 7.0 (GLOVE) ×4 IMPLANT
GOWN STRL REUS W/TWL LRG LVL3 (GOWN DISPOSABLE) ×4 IMPLANT
KIT TURNOVER CYSTO (KITS) ×2 IMPLANT
NS IRRIG 1000ML POUR BTL (IV SOLUTION) ×2 IMPLANT
PACK VAGINAL MINOR WOMEN LF (CUSTOM PROCEDURE TRAY) ×2 IMPLANT
PAD OB MATERNITY 4.3X12.25 (PERSONAL CARE ITEMS) ×2 IMPLANT
PENCIL SMOKE EVACUATOR (MISCELLANEOUS) IMPLANT
PUNCH BIOPSY 3 (MISCELLANEOUS) IMPLANT
SCRUB CHG 4% DYNA-HEX 4OZ (MISCELLANEOUS) ×2 IMPLANT
SLEEVE SCD COMPRESS KNEE MED (STOCKING) ×2 IMPLANT
SUT MON AB 2-0 SH 27 (SUTURE) IMPLANT
SUT VIC AB 3-0 PS2 18XBRD (SUTURE) IMPLANT
TOWEL OR 17X24 6PK STRL BLUE (TOWEL DISPOSABLE) ×4 IMPLANT
UNDERPAD 30X36 HEAVY ABSORB (UNDERPADS AND DIAPERS) ×2 IMPLANT
WATER STERILE IRR 500ML POUR (IV SOLUTION) IMPLANT

## 2023-08-17 NOTE — Discharge Instructions (Addendum)
Vaginal rest (nothing inserted in vagina) Use peri bottle with water as directed Lidocaine ointment as needed for pain Ibuprofen for mild to moderate pain Narcotic prescription for severe pain   Post Anesthesia Home Care Instructions  Activity: Get plenty of rest for the remainder of the day. A responsible individual must stay with you for 24 hours following the procedure.  For the next 24 hours, DO NOT: -Drive a car -Advertising copywriter -Drink alcoholic beverages -Take any medication unless instructed by your physician -Make any legal decisions or sign important papers.  Meals: Start with liquid foods such as gelatin or soup. Progress to regular foods as tolerated. Avoid greasy, spicy, heavy foods. If nausea and/or vomiting occur, drink only clear liquids until the nausea and/or vomiting subsides. Call your physician if vomiting continues.  Special Instructions/Symptoms: Your throat may feel dry or sore from the anesthesia or the breathing tube placed in your throat during surgery. If this causes discomfort, gargle with warm salt water. The discomfort should disappear within 24 hours.    D & C Home care Instructions:   Personal hygiene:  Used sanitary napkins for vaginal drainage not tampons. Shower or tub bathe the day after your procedure. No douching until bleeding stops. Always wipe from front to back after  Elimination.  Activity: Do not drive or operate any equipment today. The effects of the anesthesia are still present and drowsiness may result. Rest today, not necessarily flat bed rest, just take it easy. You may resume your normal activity in one to 2 days.  Sexual activity: No intercourse for one week or as indicated by your physician  Diet: Eat a light diet as desired this evening. You may resume a regular diet tomorrow.  Return to work: One to 2 days.  General Expectations of your surgery: Vaginal bleeding should be no heavier than a normal period. Spotting may  continue up to 10 days. Mild cramps may continue for a couple of days. You may have a regular period in 2-6 weeks.  Unexpected observations call your doctor if these occur: persistent or heavy bleeding. Severe abdominal cramping or pain. Elevation of temperature greater than 100F.   May take take Tylenol beginning at 2:30 PM as needed for soreness/discomfort. Do not take with Hydrocodone as it contains Tylenol.

## 2023-08-17 NOTE — Anesthesia Postprocedure Evaluation (Signed)
Anesthesia Post Note  Patient: Adlean Newhard  Procedure(s) Performed: COLPOSCOPY (Vagina ) EXAM UNDER ANESTHESIA (Vagina ) VULVAR AND VAGINAL BIOPSY (Vagina )     Patient location during evaluation: PACU Anesthesia Type: General Level of consciousness: awake and alert, oriented and patient cooperative Pain management: pain level controlled Vital Signs Assessment: post-procedure vital signs reviewed and stable Respiratory status: spontaneous breathing, nonlabored ventilation and respiratory function stable Cardiovascular status: blood pressure returned to baseline and stable Postop Assessment: no apparent nausea or vomiting Anesthetic complications: no   No notable events documented.  Last Vitals:  Vitals:   08/17/23 1045 08/17/23 1100  BP: 132/78 134/77  Pulse: 74 66  Resp: 12 (!) 8  Temp:    SpO2: 100% 99%    Last Pain:  Vitals:   08/17/23 1100  TempSrc:   PainSc: 0-No pain                 Lannie Fields

## 2023-08-17 NOTE — Transfer of Care (Signed)
Immediate Anesthesia Transfer of Care Note  Patient: Amanda Schultz  Procedure(s) Performed: Procedure(s) (LRB): COLPOSCOPY (N/A) EXAM UNDER ANESTHESIA (N/A) VULVAR AND VAGINAL BIOPSY  Patient Location: PACU  Anesthesia Type: GA  Level of Consciousness: awake, sedated, patient cooperative and responds to stimulation  Airway & Oxygen Therapy: Patient Spontanous Breathing and Patient connected to [place] oxygen  Post-op Assessment: Report given to PACU RN, Post -op Vital signs reviewed and stable and Patient moving all extremities  Post vital signs: Reviewed and stable  Complications: No apparent anesthesia complications

## 2023-08-17 NOTE — Anesthesia Procedure Notes (Signed)
Procedure Name: LMA Insertion Date/Time: 08/17/2023 9:39 AM  Performed by: Jessica Priest, CRNAPre-anesthesia Checklist: Patient identified, Emergency Drugs available, Suction available, Patient being monitored and Timeout performed Patient Re-evaluated:Patient Re-evaluated prior to induction Oxygen Delivery Method: Circle system utilized Preoxygenation: Pre-oxygenation with 100% oxygen Induction Type: IV induction Ventilation: Mask ventilation without difficulty LMA: LMA inserted LMA Size: 4.0 Number of attempts: 1 Airway Equipment and Method: Bite block Placement Confirmation: positive ETCO2, breath sounds checked- equal and bilateral and CO2 detector Tube secured with: Tape Dental Injury: Teeth and Oropharynx as per pre-operative assessment

## 2023-08-17 NOTE — H&P (Signed)
Amanda Schultz is an 67 y.o. female. 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  Pt has hx of lichen sclerosis; however, histology showed +p16 / HPV related.  Pt has vulvodynia, vaginissmus, and vaginal atophy which makes pelvic exams difficult and painful.  For pain control and to be able to adequately excise the VIN 2 at the introitus, elected for procedure in the OR.    Pertinent Gynecological History: Menses: post-menopausal Bleeding: none Contraception: none DES exposure: denies Blood transfusions: none Sexually transmitted diseases: no past history Previous GYN Procedures:  none   Last mammogram: normal Date: 06/28/23 Last pap: normal Date: 2019--in care everywhere  Menstrual History: No LMP recorded. Patient is postmenopausal.    Past Medical History:  Diagnosis Date   Arthritis    right knees, left hand, back   Basal cell carcinoma    x 2 on face   Lichen sclerosus    vulvodynia   Osteopenia    left forearm   PONV (postoperative nausea and vomiting)     Past Surgical History:  Procedure Laterality Date   CARPAL TUNNEL RELEASE Bilateral    COLONOSCOPY  2021   no polyps   KNEE ARTHROSCOPY Right    x3   MOHS SURGERY     x 2 , for basal cell carcinoma of face   SHOULDER ARTHROSCOPY Left    x4   SHOULDER SURGERY Right    TRIGGER FINGER RELEASE     both thumbs   TUBAL LIGATION     VULVA / PERINEUM BIOPSY  06/25/2023   HSIL, lichen schlerosis    Family History  Problem Relation Age of Onset   Alzheimer's disease Mother    Hypertension Father     Social History:  reports that she has never smoked. She has never used smokeless tobacco. She reports current alcohol use. She reports that she does not use drugs.  Allergies: No Known Allergies  Medications Prior to Admission  Medication Sig Dispense Refill Last Dose/Taking   azelastine (ASTELIN) 0.1 % nasal spray Place into both nostrils 2 (two) times daily. Use in each nostril as directed   Taking   calcium-vitamin D (OSCAL  WITH D) 500-5 MG-MCG tablet Take 1 tablet by mouth.   Taking   cetirizine (ZYRTEC) 10 MG tablet Take 10 mg by mouth as needed for allergies.   Taking As Needed   diphenhydrAMINE (BENADRYL) 25 mg capsule Take 25 mg by mouth every 6 (six) hours as needed.   Taking As Needed   docusate (COLACE) 60 MG/15ML syrup Take 60 mg by mouth daily.   Taking   fexofenadine (ALLEGRA) 180 MG tablet Take 180 mg by mouth as needed for allergies or rhinitis.   Taking As Needed   fluticasone (FLONASE) 50 MCG/ACT nasal spray Place into both nostrils daily.   Taking   clobetasol ointment (TEMOVATE) 0.05 % Apply 1 Application topically 2 (two) times a week. 30 g 1    Multiple Vitamin (MULTIVITAMIN) tablet Take by mouth.      Prasterone (INTRAROSA) 6.5 MG INST Insert 1 applicator full per vagina nightly and taper as tolerated to twice a week. 30 each 12     Review of Systems  Constitutional: Negative.   Respiratory: Negative.    Cardiovascular: Negative.   Gastrointestinal: Negative.   Genitourinary: Negative.     Weight 68.5 kg. Physical Exam Vitals reviewed.  Constitutional:      General: She is not in acute distress.    Appearance: She is well-developed.  HENT:     Head: Normocephalic and atraumatic.  Eyes:     Conjunctiva/sclera: Conjunctivae normal.  Neck:     Thyroid: No thyromegaly.  Cardiovascular:     Rate and Rhythm: Normal rate and regular rhythm.  Pulmonary:     Effort: Pulmonary effort is normal.     Breath sounds: Normal breath sounds.  Abdominal:     General: Abdomen is flat.     Palpations: Abdomen is soft.     Tenderness: There is no abdominal tenderness.  Genitourinary:    Comments: Exam will be repeated in the OR Musculoskeletal:        General: No tenderness. Normal range of motion.     Cervical back: Neck supple.  Skin:    General: Skin is warm and dry.  Neurological:     Mental Status: She is alert and oriented to person, place, and time.  Psychiatric:        Mood and  Affect: Mood normal.        Behavior: Behavior normal.    Vitals:   08/14/23 1602 08/17/23 0815  BP:  (!) 160/81  Pulse:  80  Resp:  17  Temp:  97.7 F (36.5 C)  TempSrc:  Oral  SpO2:  99%  Weight: 68.5 kg 69.4 kg  Height:  5\' 1"  (1.549 m)   CBC    Component Value Date/Time   WBC 4.4 08/17/2023 0826   RBC 4.30 08/17/2023 0826   HGB 14.6 08/17/2023 0826   HCT 42.5 08/17/2023 0826   PLT 164 08/17/2023 0826   MCV 98.8 08/17/2023 0826   MCH 34.0 08/17/2023 0826   MCHC 34.4 08/17/2023 0826   RDW 13.2 08/17/2023 0826     Assessment/Plan: 67 yo female with VIN 2 for excision as well as pap smear, vaginal and vulvar colposcopy and necessary associated biopsies.   Informed consent was obtained--risk include but not limited to bleeding, infection, need for further excision if margins are not clear.   Elsie Lincoln 08/17/2023, 7:52 AM

## 2023-08-17 NOTE — Brief Op Note (Signed)
08/17/2023  10:33 AM  PATIENT:  Amanda Schultz  67 y.o. female  PRE-OPERATIVE DIAGNOSIS:  Vulvar intraepithelial neoplasia 2  POST-OPERATIVE DIAGNOSIS:  Vulvar intraepithelial neoplasia 2  PROCEDURE:  Procedure(s): COLPOSCOPY (N/A) EXAM UNDER ANESTHESIA (N/A) VULVAR AND VAGINAL BIOPSY  SURGEON:  Surgeons and Role:    * Lesly Dukes, MD - Primary  PHYSICIAN ASSISTANT:   ASSISTANTS: none   ANESTHESIA:   general  EBL:  30 mL   BLOOD ADMINISTERED:none  DRAINS: none   LOCAL MEDICATIONS USED:  LIDOCAINE   SPECIMEN:  Source of Specimen:  2 punch biopsies and 1 excision biopsy  DISPOSITION OF SPECIMEN:  PATHOLOGY  COUNTS:  YES  TOURNIQUET:  * No tourniquets in log *  DICTATION: .Dragon Dictation  PLAN OF CARE: Discharge to home after PACU  PATIENT DISPOSITION:  PACU - hemodynamically stable.   Delay start of Pharmacological VTE agent (>24hrs) due to surgical blood loss or risk of bleeding: not applicable

## 2023-08-18 ENCOUNTER — Encounter (HOSPITAL_BASED_OUTPATIENT_CLINIC_OR_DEPARTMENT_OTHER): Payer: Self-pay | Admitting: Obstetrics & Gynecology

## 2023-08-26 ENCOUNTER — Encounter: Payer: Self-pay | Admitting: Obstetrics & Gynecology

## 2023-08-28 NOTE — Op Note (Signed)
PATIENT:  Amanda Schultz  67 y.o. female   PRE-OPERATIVE DIAGNOSIS:  Vulvar intraepithelial neoplasia 2   POST-OPERATIVE DIAGNOSIS:  Vulvar intraepithelial neoplasia 2   PROCEDURE:  Procedure(s): COLPOSCOPY (N/A) EXAM UNDER ANESTHESIA (N/A) VULVAR AND VAGINAL BIOPSY   SURGEON:  Surgeons and Role:    * Lesly Dukes, MD - Primary    ASSISTANTS: none    ANESTHESIA:   general   EBL:  30 mL    BLOOD ADMINISTERED:none   DRAINS: none    LOCAL MEDICATIONS USED:  LIDOCAINE    SPECIMEN:  Source of Specimen:  2 punch biopsies and 1 excision biopsy   DISPOSITION OF SPECIMEN:  PATHOLOGY   COUNTS:  YES   TOURNIQUET:  * No tourniquets in log *   DICTATION: Patient was taken to the operating room where general anethesia was induced.  The patient was placed in dorsal lithotomy posiiton and a time out was performed.   Gauze soaked in acetic acid were placed along the patient's labia majora, labia minora and perineum.  A speculum was then placed into the patient's vagina and the cervix was brought into view.  A Pap smear was obtained.  The cervical os was noted to be stenotic and did not admit the Cytobrush.  Acetic acid was then applied to the cervix and upper vagina.  Colposcopy was performed.  Lugol solution was placed in the upper vagina and cervix and viewed with the colposcopy.  There were no areas of concern.  After 5 minutes, the acetic acid soaked gauze was removed from the vulva and perineum.  Colposcopy was then performed of the lower vagina and vulva.  Stable lichen sclerosus was noted on the labia majora.  At the introitus there was noted to be diffuse acetowhite in the lower vagina and introitus.  Biopsies taken at 10 and 2 with 3 mm punch.  The fourchette/hymenal remnants containing the larger raised lesion was excised with a scalpel.  This area is contiguous with the acetowhite areas laterally as depicted below.  The incision was re-approximated with 2-0 Monocryl.  Good hemostasis  and restoration of anatomy was achieved.  Pt went to the recovery room in stale condition.          PLAN OF CARE: Discharge to home after PACU   PATIENT DISPOSITION:  PACU - hemodynamically stable.

## 2023-09-04 LAB — CYTOLOGY - PAP
Adequacy: ABSENT
Comment: NEGATIVE
Diagnosis: NEGATIVE
High risk HPV: NEGATIVE

## 2023-09-05 LAB — SURGICAL PATHOLOGY

## 2023-09-17 ENCOUNTER — Ambulatory Visit (INDEPENDENT_AMBULATORY_CARE_PROVIDER_SITE_OTHER): Payer: Medicare Other | Admitting: Obstetrics & Gynecology

## 2023-09-17 ENCOUNTER — Encounter: Payer: Self-pay | Admitting: Obstetrics & Gynecology

## 2023-09-17 VITALS — BP 128/74 | Ht 61.0 in | Wt 154.0 lb

## 2023-09-17 DIAGNOSIS — N952 Postmenopausal atrophic vaginitis: Secondary | ICD-10-CM | POA: Diagnosis not present

## 2023-09-17 DIAGNOSIS — L900 Lichen sclerosus et atrophicus: Secondary | ICD-10-CM

## 2023-09-17 DIAGNOSIS — N942 Vaginismus: Secondary | ICD-10-CM

## 2023-09-17 DIAGNOSIS — N891 Moderate vaginal dysplasia: Secondary | ICD-10-CM | POA: Diagnosis not present

## 2023-09-17 NOTE — Progress Notes (Signed)
Pt has no concerns

## 2023-09-17 NOTE — Progress Notes (Signed)
Subjective:    Patient ID: Amanda Schultz, female    DOB: 10/13/1955, 68 y.o.   MRN: [id number]  HPI  68 yo female presents for post op visit after colposcopy of cervix, vagina, vulva and wide excession of VAIN 2.  Patient also had biopsies done on 2 areas that were acetowhite.  Details of the operative procedure can be found in a note in epic.  Has been doing well since the procedure.  She has restarted the clobetasol on the labia majora near the clitoris.  She has not used steroid or Intrarosa vaginally.  She has not had intercourse.  She still having some bit of discharge that is clear.  Patient denies bleeding or foul odor or pain.   Review of Systems  Constitutional: Negative.   Respiratory: Negative.    Cardiovascular: Negative.   Gastrointestinal: Negative.   Genitourinary: Negative.        Small amount of clear discharge; no pain or bleeding.        Objective:   Physical Exam Vitals reviewed.  Constitutional:      General: She is not in acute distress.    Appearance: She is well-developed.  HENT:     Head: Normocephalic and atraumatic.  Eyes:     Conjunctiva/sclera: Conjunctivae normal.  Cardiovascular:     Rate and Rhythm: Normal rate.  Pulmonary:     Effort: Pulmonary effort is normal.  Genitourinary:   Skin:    General: Skin is warm and dry.  Neurological:     Mental Status: She is alert and oriented to person, place, and time.  Psychiatric:        Mood and Affect: Mood normal.    Vitals:   09/17/23 0819  BP: 128/74  Weight: 154 lb (69.9 kg)  Height: 5\' 1"  (1.549 m)   Component Ref Range & Units (hover) 1 mo ago  High risk HPV Negative VC  Adequacy Satisfactory for evaluation; transformation zone component ABSENT. VC  Diagnosis - Negative for intraepithelial lesion or malignancy (NILM) VC  Comment Normal Reference Range HPV - Negative VC  Resulting Agency [facility]        Specimen Collected: 08/17/23 09:52 Last Resulted: 09/04/23 12:44      ATIENT: Jolene Schimke Surgical Pathology Rep *Addendum *  Reason for Amendment #1: Change in specimen source Reason for Addendum #1:  Immunohistochemistry results  Clinical History: Vulvar Intraepithelial neoplasia (las)    FINAL MICROSCOPIC DIAGNOSIS:  A. RIGHT VULVA, 2:00, BIOPSY: Mild squamous dysplasia (LSIL, VAIN 1)  B. LEFT LABIA MINORA, 10:00, BIOPSY: Benign squamous mucosa Negative for dysplasia and diagnostic viral change  C. VULVA, INTROITUS, 5:00 TO 7:00, EXCISION: Mild to moderate squamous dysplasia (HSIL, VAIN 2) HSIL focally present in lateral/side margin   GROSS DESCRIPTION:  Specimen A: Received in formalin is a pink-white soft tissue fragment that is submitted in toto.  Size: 0.4 cm, 1 block submitted.  Specimen B: Received in formalin is a pink-white soft tissue fragment that is submitted in toto.  Size: 0.25 cm, 1 block submitted.  Specimen C: Received in formalin is a 3.5 cm in length unoriented portion of pink-white to hyperemic smooth to vaguely wrinkled skin/mucosa which ranges in width from 0.3 to 1.1 cm and is up to 0.2 cm thick.  The tissue is inked, serially sectioned and entirely submitted in 3 blocks.  SW 08/17/2023   Final Diagnosis performed by Jerene Bears, MD.   Electronically signed 08/20/2023 Amendment #1 performed by Jerene Bears,  MD.   Electronically signed 09/04/2023 Technical component performed at [facility], [address], [place], [place] [address].  Professional component performed at [employer]. [facility], [address], [place], [place] [address].  Immunohistochemistry Technical component (if applicable) was performed at [facility]. [address], [place], [place] [address].   IMMUNOHISTOCHEMISTRY DISCLAIMER (if applicable): Some of these immunohistochemical stains may have been developed and the performance characteristics determine by  [facility]. Some may not have been cleared or approved by the U.S. Food and Drug Administration. The FDA has determined that such clearance or approval is not necessary. This test is used for clinical purposes. It should not be regarded as investigational or for research. This laboratory is certified under the Clinical Laboratory Improvement Amendments of 1988 (CLIA-88) as qualified to perform high complexity clinical laboratory testing.  The controls stained appropriately.   IHC stains are performed on formalin fixed, paraffin embedded tissue using a 3,3"diaminobenzidine (DAB) chromogen and Leica Bond Autostainer System. The staining intensity of the nucleus is score manually and is reported as the percentage of tumor cell nuclei demonstrating specific nuclear staining. The specimens are fixed in 10% Neutral Formalin for at least 6 hours and up to 72hrs. These tests are validated on decalcified tissue. Results should be interpreted with caution given the possibility of false negative results on decalcified specimens. Antibody Clones are as follows ER-clone 18F, PR-clone 16, Ki67- clone MM1. Some of these immunohistochemical stains may have been developed and the performance characteristics determined by [facility].  ADDENDUM: At the clinicians request, an immunohistochemical stain for the HPV surrogate marker p16 is performed on each of the specimens.  The right vulva biopsy at 2:00 (specimen A) shows only rare squamous cells within the epithelium to be positive for p16.  The epithelium within the left labia minora biopsy at 10:00 (specimen B) is negative for p16.  The foci of high-grade dysplasia within the vulvar introitus excision (specimen C) show diffuse strong full-thickness staining for the HPV surrogate marker p16.      Assessment & Plan:  68 yo female with VAIN s/p wide excision and colposcopy    Wide excision--Visually and with colposcopy, the white  sessile region was widely resected with no residual disase.  + margin on pathology.  +p16 strong.  Discussed case with Dr. Pricilla Holm, Gyn Onc who adviased q 6 months exams with colopo 1x per year. Other option would be to back to OR for wider excision although would be difficult to know where to stop given all grossly appearing tissue was excised. Pt opts for q 6 month exams with one being colposcopy.  Vain 1 on  right labial/vaginal biopsy-- junction of labia majora and vagina--surveillance and no treatment. +p16. 3.  Continue clobetasol on labia majora/mos near clitoris. Restart Intrarosa--2 week ramp up then 2x week. If vaginal introitus is still friable, will prescribe compounded estrogen cream with coconut oil.  RTC 6 weeks.

## 2023-10-29 ENCOUNTER — Ambulatory Visit (INDEPENDENT_AMBULATORY_CARE_PROVIDER_SITE_OTHER): Payer: Medicare Other | Admitting: Obstetrics & Gynecology

## 2023-10-29 ENCOUNTER — Encounter: Payer: Self-pay | Admitting: Obstetrics & Gynecology

## 2023-10-29 VITALS — BP 125/69 | HR 80 | Ht 61.0 in | Wt 155.0 lb

## 2023-10-29 DIAGNOSIS — N942 Vaginismus: Secondary | ICD-10-CM

## 2023-10-29 DIAGNOSIS — L900 Lichen sclerosus et atrophicus: Secondary | ICD-10-CM

## 2023-10-29 DIAGNOSIS — N952 Postmenopausal atrophic vaginitis: Secondary | ICD-10-CM | POA: Diagnosis not present

## 2023-10-29 DIAGNOSIS — N891 Moderate vaginal dysplasia: Secondary | ICD-10-CM | POA: Insufficient documentation

## 2023-10-29 NOTE — Progress Notes (Signed)
   Subjective:    Patient ID: Amanda Schultz, female    DOB: 1955-11-03, 68 y.o.   MRN: [id number]  HPI  68 yo female present fro f/u of VAIN.  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  She is going to allergist and getting full testing. 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   She has not had vaginal complaints--no itching or burning.  Has not ordered dilators yet and intends to after this appt.  Has not been intimate lately--the last 2 times hurt bad and caused bleeding.   Review of Systems  Constitutional: Negative.   HENT:  Positive for congestion, postnasal drip and rhinorrhea.   Cardiovascular: Negative.   Genitourinary:  Positive for dyspareunia. Negative for vaginal discharge and vaginal pain.  Psychiatric/Behavioral: Negative.         Objective:   Physical Exam Vitals reviewed.  Constitutional:      General: She is not in acute distress.    Appearance: She is well-developed.  HENT:     Head: Normocephalic and atraumatic.  Eyes:     Conjunctiva/sclera: Conjunctivae normal.  Cardiovascular:     Rate and Rhythm: Normal rate.  Pulmonary:     Effort: Pulmonary effort is normal.  Skin:    General: Skin is warm and dry.  Neurological:     Mental Status: She is alert and oriented to person, place, and time.  Psychiatric:        Mood and Affect: Mood normal.    Vitals:   10/29/23 0807  BP: 125/69  Pulse: 80  Weight: 155 lb (70.3 kg)  Height: 5\' 1"  (1.549 m)       Assessment & Plan:  68 yo female with LS and s/p VAIN resection   LS superior to clitoris is stable--no plaques; Continue clobetasol Introitus--no firm or white areas.  No bleeding with exam.  Tissue less friable  Continue Intrarosa and vulvar moisturizer Cale will send me a picture of what she is using through my chart). Order and use vaginal dilators; resume sexual activity as desired--avoid painful interactions.  Can restart pelvic PT--however, given husband is not  working, would like to follow the home exercises she was given.  RTC 6 months or sooner if she develops fir areas on vulva or vagina or itching/bleeding occurs.    26 minutes was spent during the encounter to include--review of records, history and physical, capturing of clinical media, counseling, and documentation

## 2023-10-30 ENCOUNTER — Encounter: Payer: Self-pay | Admitting: Obstetrics & Gynecology

## 2024-05-26 ENCOUNTER — Ambulatory Visit: Admitting: Obstetrics & Gynecology

## 2024-05-26 ENCOUNTER — Encounter: Payer: Self-pay | Admitting: Obstetrics & Gynecology

## 2024-05-26 ENCOUNTER — Other Ambulatory Visit (HOSPITAL_COMMUNITY)
Admission: RE | Admit: 2024-05-26 | Discharge: 2024-05-26 | Disposition: A | Discharge status: Home / Self Care | Source: Ambulatory Visit | Attending: Obstetrics & Gynecology | Admitting: Obstetrics & Gynecology

## 2024-05-26 VITALS — BP 130/81 | HR 75 | Ht 61.0 in | Wt 157.0 lb

## 2024-05-26 DIAGNOSIS — N891 Moderate vaginal dysplasia: Secondary | ICD-10-CM | POA: Diagnosis not present

## 2024-05-26 DIAGNOSIS — L900 Lichen sclerosus et atrophicus: Secondary | ICD-10-CM

## 2024-05-26 NOTE — Progress Notes (Signed)
   Subjective:    Patient ID: Amanda Schultz, female    DOB: 11/05/1955, 68 y.o.   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  Pt has been dealing with ortho issues--right shoulder pain and diagnosed with 2 tears and needs shoulder replacement.  She also has a right trigger finger.  Seeing Dr. Tonnie.  Husband has been worked up for lung nodule--negative biopsy.    [place] has not been able to get the vaginal dilators yet due to above health issues.  She is using hte topical moisturizer,, intrarosa  2x a week and clobetasol  2x a week.  Occasionally it will lbe a little itchy and she will increase the clobetasol  administration.  She is not having increased symptoms or hard areas.    Review of Systems  Constitutional: Negative.   Respiratory: Negative.    Cardiovascular: Negative.   Gastrointestinal: Negative.   Genitourinary: Negative.  Negative for pelvic pain, vaginal bleeding and vaginal discharge.       Some vaginal itching nad burning--not present today       Objective:   Physical Exam Vitals reviewed.  Constitutional:      General: She is not in acute distress.    Appearance: She is well-developed.  HENT:     Head: Normocephalic and atraumatic.  Eyes:     Conjunctiva/sclera: Conjunctivae normal.  Cardiovascular:     Rate and Rhythm: Normal rate.  Pulmonary:     Effort: Pulmonary effort is normal.  Genitourinary:    Comments: Small sessile area on the right hymnal ring.  Not present on last exam image.  Biopsy recommended.  Small stable urethral caruncle.  Skin:    General: Skin is warm and dry.  Neurological:     Mental Status: She is alert and oriented to person, place, and time.  Psychiatric:        Mood and Affect: Mood normal.      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  It could be a hymnal ring remnant.  Will biopsy today to r/o recurrence of VAIN Rest of vulvar exam is benign Continue intrarosa  and clobetasol .  .   VAGINAL BIOPSY NOTE  The indications for vaginal biopsy (rule out neoplasia, establish lichen sclerosus diagnosis) were reviewed.   Risks of the biopsy including pain, bleeding, infection, inadequate specimen, scarring and need for additional procedures  were discussed. The patient stated understanding and agreed to undergo procedure today. Consent was signed,  time out performed.  5% lidocaine  ointment applied to area. The patient's vulva was prepped with Betadine . 1% lidocaine  was injected into right lower vagina approx 7-8 o'clock near hymenal ring. A Tisch biopsy forceps was used to remove the small lesion  Small bleeding was noted and hemostasis was achieved using silver nitrate sticks.  The patient tolerated the procedure well. Post-procedure instructions  (pelvic rest for one week) were given to the patient. The patient is to call with heavy bleeding, fever greater than 100.4, foul smelling vaginal discharge or other concerns. The patient will be return to clinic in two weeks for discussion of results.

## 2024-05-28 LAB — SURGICAL PATHOLOGY

## 2024-06-03 ENCOUNTER — Encounter: Payer: Self-pay | Admitting: Obstetrics & Gynecology

## 2024-06-12 ENCOUNTER — Other Ambulatory Visit: Payer: Self-pay | Admitting: Obstetrics & Gynecology

## 2024-06-12 DIAGNOSIS — L900 Lichen sclerosus et atrophicus: Secondary | ICD-10-CM

## 2024-06-16 ENCOUNTER — Other Ambulatory Visit: Payer: Self-pay

## 2024-06-18 ENCOUNTER — Other Ambulatory Visit: Payer: Self-pay

## 2024-06-18 DIAGNOSIS — N952 Postmenopausal atrophic vaginitis: Secondary | ICD-10-CM

## 2024-06-18 DIAGNOSIS — L900 Lichen sclerosus et atrophicus: Secondary | ICD-10-CM

## 2024-06-18 MED ORDER — CLOBETASOL PROPIONATE 0.05 % EX OINT: 1.0000 | TOPICAL_OINTMENT | CUTANEOUS | 1 refills | Status: AC

## 2024-06-18 MED ORDER — INTRAROSA 6.5 MG VA INST: VAGINAL_INSERT | VAGINAL | 6 refills | Status: AC

## 2024-06-20 NOTE — Progress Notes (Signed)
 Received need for PA for Intrarosa . Sent through Cover My Meds, pending.  Silvano LELON Piano, RN

## 2024-06-30 ENCOUNTER — Ambulatory Visit: Payer: Self-pay | Admitting: Obstetrics & Gynecology

## 2024-06-30 ENCOUNTER — Encounter: Payer: Self-pay | Admitting: Obstetrics & Gynecology

## 2024-07-28 ENCOUNTER — Encounter: Payer: Self-pay | Admitting: Obstetrics & Gynecology

## 2024-07-28 ENCOUNTER — Other Ambulatory Visit: Payer: Self-pay | Admitting: Obstetrics & Gynecology

## 2024-07-28 ENCOUNTER — Other Ambulatory Visit (HOSPITAL_COMMUNITY)
Admission: RE | Admit: 2024-07-28 | Discharge: 2024-07-28 | Disposition: A | Source: Ambulatory Visit | Attending: Obstetrics & Gynecology | Admitting: Obstetrics & Gynecology

## 2024-07-28 ENCOUNTER — Ambulatory Visit (INDEPENDENT_AMBULATORY_CARE_PROVIDER_SITE_OTHER): Admitting: Obstetrics & Gynecology

## 2024-07-28 VITALS — BP 143/84 | HR 82 | Ht 61.0 in | Wt 161.0 lb

## 2024-07-28 DIAGNOSIS — Z124 Encounter for screening for malignant neoplasm of cervix: Secondary | ICD-10-CM | POA: Diagnosis not present

## 2024-07-28 DIAGNOSIS — N891 Moderate vaginal dysplasia: Secondary | ICD-10-CM

## 2024-07-28 DIAGNOSIS — L900 Lichen sclerosus et atrophicus: Secondary | ICD-10-CM | POA: Diagnosis not present

## 2024-07-28 DIAGNOSIS — Z1231 Encounter for screening mammogram for malignant neoplasm of breast: Secondary | ICD-10-CM

## 2024-07-28 DIAGNOSIS — Z01419 Encounter for gynecological examination (general) (routine) without abnormal findings: Secondary | ICD-10-CM | POA: Diagnosis present

## 2024-07-28 DIAGNOSIS — N952 Postmenopausal atrophic vaginitis: Secondary | ICD-10-CM

## 2024-07-28 DIAGNOSIS — Z1151 Encounter for screening for human papillomavirus (HPV): Secondary | ICD-10-CM | POA: Diagnosis not present

## 2024-07-28 MED ORDER — ESTRADIOL 0.01 % VA CREA: TOPICAL_CREAM | VAGINAL | 12 refills | Status: DC

## 2024-07-28 MED ORDER — ESTRADIOL 0.01 % VA CREA: TOPICAL_CREAM | VAGINAL | 12 refills | Status: AC

## 2024-07-29 LAB — CYTOLOGY - PAP
Adequacy: ABSENT
Comment: NEGATIVE
Diagnosis: NEGATIVE
High risk HPV: NEGATIVE

## 2024-07-31 ENCOUNTER — Encounter

## 2024-07-31 DIAGNOSIS — Z1231 Encounter for screening mammogram for malignant neoplasm of breast: Secondary | ICD-10-CM

## 2024-07-31 NOTE — Progress Notes (Signed)
   Subjective:    Patient ID: Amanda Schultz, female    DOB: [birth year], 68 y.o.   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  Pt had negative exam on 10/29/23.  On 05/26/24 she was found to have a small mass on right side vagina (near hymenal ring--picture on 9/29 visit note) which was biopsied with pathology of VAIN 1.  Pt returns today for colposcopy.  She is asymptomatic.  She uses clobetasol  on LS which is peri clitoral and Intrarosa  vaginally for vaginal dryness and GSM.    Review of Systems  Constitutional: Negative.   Respiratory: Negative.    Cardiovascular: Negative.   Gastrointestinal: Negative.   Genitourinary: Negative.        Objective:   Physical Exam Vitals reviewed.  Constitutional:      General: She is not in acute distress.    Appearance: She is well-developed.  HENT:     Head: Normocephalic and atraumatic.  Eyes:     Conjunctiva/sclera: Conjunctivae normal.  Cardiovascular:     Rate and Rhythm: Normal rate.  Pulmonary:     Effort: Pulmonary effort is normal.  Genitourinary:  Skin:    General: Skin is warm and dry.  Neurological:     Mental Status: She is alert and oriented to person, place, and time.  Psychiatric:        Mood and Affect: Mood normal.    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  Fourchette/6 o'clock of introitus tore when spreading labia majora and minora to visualize lower vagina.  Dure to pain and friable tissue, colposcopy stopped.  Discussed options with [facility].  Estrace  cream has been too expensive in the past which is why she uses Intrarosa .  I advised using estrace  cream to introitus nightly for 30 days before next exam.    Continue to apply clobetasol  do peri clitoral area twice a week--this area is stable.  Possible folliculitis on mons pubis--pt to monitor; if not better in 2 week, will need to biopsy this area as well.  Will refer to gyn oncology for next exam.  Exams are painful and if she can only tolerate one examk I would prefer to guidance and experience of gyn onc.  Will send for referral after Estrace  cream is used and tissue is less friable.

## 2024-08-01 ENCOUNTER — Encounter

## 2024-08-01 ENCOUNTER — Encounter: Payer: Self-pay | Admitting: Obstetrics & Gynecology

## 2024-08-01 DIAGNOSIS — Z1231 Encounter for screening mammogram for malignant neoplasm of breast: Secondary | ICD-10-CM

## 2024-08-04 ENCOUNTER — Other Ambulatory Visit: Payer: Self-pay

## 2024-08-04 DIAGNOSIS — R102 Pelvic and perineal pain unspecified side: Secondary | ICD-10-CM

## 2024-08-04 DIAGNOSIS — N890 Mild vaginal dysplasia: Secondary | ICD-10-CM

## 2024-08-04 DIAGNOSIS — N891 Moderate vaginal dysplasia: Secondary | ICD-10-CM

## 2024-08-04 DIAGNOSIS — N952 Postmenopausal atrophic vaginitis: Secondary | ICD-10-CM

## 2024-08-05 ENCOUNTER — Telehealth: Payer: Self-pay | Admitting: *Deleted

## 2024-08-05 NOTE — Telephone Encounter (Signed)
 Spoke with the patient regarding the referral to GYN oncology. Patient scheduled as new patient with Dr Viktoria on 1/8 at 9:45 am. Patient given an arrival time of 9:15 am.  Explained to the patient the the doctor will perform a pelvic exam at this visit. Patient given the policy that only one visitor allowed and that visitor must be over 16 yrs are allowed in the Cancer Center. Patient given the address/phone number for the clinic and that the center offers free valet service. Patient aware that masks optional.

## 2024-08-05 NOTE — Telephone Encounter (Signed)
 LMOM for the patient to call the office back. Patient needs to be scheduled for a new patient appt on either 1/8, 1/9 or 1/12.

## 2024-08-09 ENCOUNTER — Ambulatory Visit: Payer: Self-pay | Admitting: Obstetrics & Gynecology

## 2024-09-02 ENCOUNTER — Encounter: Payer: Self-pay | Admitting: Gynecologic Oncology

## 2024-09-03 NOTE — Progress Notes (Signed)
 GYNECOLOGIC ONCOLOGY NEW PATIENT CONSULTATION   Patient Name: Amanda Schultz  Patient Age: 69 y.o. Date of Service: 09/04/2024 Referring Provider: Burnard Pate, MD  Primary Care Provider: Mena Sherlean NOVAK, MD Consulting Provider: Comer Dollar, MD   Assessment/Plan:  Postmenopausal patient with lichen sclerosus, history of vaginal dysplasia.  Overall, from a symptom standpoint, the patient has been doing very well.  We reviewed her to diagnoses as well as the treatment that she has received.  From a dysplasia standpoint, we discussed that typically low-grade dysplasia, given its low progression to cancer, is monitor closely without treatment.  High-grade dysplasia is frequently treated given higher risk of progression to cancer.  Her most recent vaginal biopsy in September showed low-grade vaginal dysplasia.  During her exam today, patient was given a mirror and we looked at her vulva together.  I pointed out some of the findings that we can see with lichen sclerosis.  Overall, her lichen seems to be in excellent control both from a symptom standpoint but also appearance of her vulva.  I encouraged her to continue using the clobetasol  twice a week and to reach out if she has more symptoms.  Colposcopy was then performed to examine the distal vagina and vulva.  Some changes along the very distal vagina from the introitus to the vulva consistent with that most low-grade dysplasia.  No areas concerning for high-grade dysplasia.  Given recent biopsy from September, my recommendation to the patient was that we continue her current regimen with repeat exam in 6 months.  If any more concerning findings at that time, we will plan on a vaginal biopsy.  Reviewed recent Pap test which was normal, high risk HPV negative.  We reviewed how to use the vaginal estrogen.  We discussed that she could decrease use to twice a week in the evening.  The month before her next visit with me in 6 months, I  encouraged her to begin using the estrogen daily again.  Reviewed signs and symptoms that should prompt a phone call to see me before her next scheduled visit.  A copy of this note was sent to the patient's referring provider.   60 minutes of total time was spent for this patient encounter, including preparation, face-to-face counseling with the patient and coordination of care, and documentation of the encounter.  Comer Dollar, MD  Division of Gynecologic Oncology  Department of Obstetrics and Gynecology  [facility]  ___________________________________________  Chief Complaint: Chief Complaint  Patient presents with   VAIN II    History of Present Illness:  Amanda Schultz is a 69 y.o. y.o. female who is seen in consultation at the request of Dr. 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  The patient initially presented with erythema of her vulva and associated symptoms including pruritus, burning, and irritation.  She endorses having pain when she would wipe.  Was diagnosed with lichen sclerosis as noted below.  02/2020: Vulvar biopsy - lichen sclerosus 02/2021: NILM pap, HR HPV negative 05/2023: Vulvar biopsy - VaIN1. 07/2023: NILM pap, HR HPV negative 07/2023: Right vulvar biopsy 2 o'c VaIN1, left labia biopsy benign, vulva introital biopsy VAIN2 05/26/24: Vaginal biopsy 7-9 o'clock at hymenal ring. VaIN1. 07/28/24: NILM pap, HR HPV negative  Currently, she is using clobetasol  twice a week which helps with her lichen related symptoms.  She still has intermittent symptoms with irritation and occasional redness.  She had previously been using the vaginal estrogen tablets (Intrarosa ) and briefly trialed  Premarin  but had abdominal cramping and so did not continue this.  Since her last OB/GYN visit, she has been using vaginal Estrace  which she used daily for 30 days and now is using twice a week.  She is not currently taking the Intrarosa .  She  endorses normal bowel and bladder function.  Endorses a good appetite without nausea or emesis.  Husband was diagnosed with early-stage lung cancer in December.  He will be starting radiation later this month.  PAST MEDICAL HISTORY:  Past Medical History:  Diagnosis Date   Arthritis    right knees, left hand, back   Basal cell carcinoma    x 2 on face   Lichen sclerosus    vulvodynia   Osteopenia    left forearm   PONV (postoperative nausea and vomiting)      PAST SURGICAL HISTORY:  Past Surgical History:  Procedure Laterality Date   CARPAL TUNNEL RELEASE Bilateral    COLONOSCOPY  2021   no polyps   COLPOSCOPY N/A 08/17/2023   Procedure: COLPOSCOPY;  Surgeon: Cris Burnard DEL, MD;  Location: [facility];  Service: Gynecology;  Laterality: N/A;   KNEE ARTHROSCOPY Right    x3   MOHS SURGERY     x 2 , for basal cell carcinoma of face   SHOULDER ARTHROSCOPY Left    x4   SHOULDER SURGERY Right    TRIGGER FINGER RELEASE     both thumbs   TUBAL LIGATION     VULVA / PERINEUM BIOPSY  06/25/2023   HSIL, lichen schlerosis   VULVA MARYBETH BIOPSY  08/17/2023   Procedure: VULVAR AND VAGINAL BIOPSY;  Surgeon: Cris Burnard DEL, MD;  Location: [facility];  Service: Gynecology;;    OB/GYN HISTORY:  OB History  Gravida Para Term Preterm AB Living  0 0 0 0 0 0  SAB IAB Ectopic Multiple Live Births  0 0 0 0 0    No LMP recorded. Patient is postmenopausal.  Age at menarche: 14  Age at menopause: unsure Hx of HRT: yes Hx of STDs: denies Last pap: see HPI History of abnormal pap smears: denies  SCREENING STUDIES:  Last mammogram: 2025  Last colonoscopy: 2024 Last bone mineral density: 2024  MEDICATIONS: Outpatient Encounter Medications as of 09/04/2024  Medication Sig   AMBULATORY NON FORMULARY MEDICATION Take 1 tablet by mouth daily. Medication Name: Tumeric   azelastine (ASTELIN) 0.1 % nasal spray Place into both nostrils 2 (two) times  daily. Use in each nostril as directed   calcium-vitamin D (OSCAL WITH D) 500-5 MG-MCG tablet Take 1 tablet by mouth.   cetirizine (ZYRTEC) 10 MG tablet Take 10 mg by mouth as needed for allergies.   clobetasol  ointment (TEMOVATE ) 0.05 % Apply 1 Application topically 2 (two) times a week.   DANDELION ROOT PO Take by mouth.   diphenhydrAMINE (BENADRYL) 25 mg capsule Take 25 mg by mouth every 6 (six) hours as needed.   docusate (COLACE) 60 MG/15ML syrup Take 60 mg by mouth daily.   estradiol  (ESTRACE ) 0.01 % CREA vaginal cream Apply to introitus nightly for 30 days and then twice a week   fluticasone (FLONASE) 50 MCG/ACT nasal spray Place into both nostrils daily.   Multiple Vitamin (MULTIVITAMIN) tablet Take by mouth.   fexofenadine (ALLEGRA) 180 MG tablet Take 180 mg by mouth as needed for allergies or rhinitis. (Patient not taking: Reported on 09/02/2024)   ipratropium (ATROVENT) 0.06 % nasal spray Place 2 sprays into  both nostrils 2 (two) times daily. (Patient not taking: Reported on 09/02/2024)   Prasterone  (INTRAROSA ) 6.5 MG INST Insert 1 applicator full per vagina nightly and taper as tolerated to twice a week. (Patient not taking: Reported on 09/02/2024)   No facility-administered encounter medications on file as of 09/04/2024.    ALLERGIES:  Allergies[1]   FAMILY HISTORY:  Family History  Problem Relation Age of Onset   Alzheimer's disease Mother    Hypertension Father    Ovarian cancer Neg Hx    Endometrial cancer Neg Hx    Breast cancer Neg Hx    Prostate cancer Neg Hx    Colon cancer Neg Hx      SOCIAL HISTORY:  Social Connections: Moderately Integrated (03/09/2024)   Received from [facility]   Social Network    How would you rate your social network (family, work, friends)?: Adequate participation with social networks    REVIEW OF SYSTEMS:  + Dyspareunia, joint pain Denies appetite changes, fevers, chills, fatigue, unexplained weight changes. Denies hearing loss, neck  lumps or masses, mouth sores, ringing in ears or voice changes. Denies cough or wheezing.  Denies shortness of breath. Denies chest pain or palpitations. Denies leg swelling. Denies abdominal distention, pain, blood in stools, constipation, diarrhea, nausea, vomiting, or early satiety. Denies dysuria, frequency, hematuria or incontinence. Denies hot flashes, pelvic pain, vaginal bleeding or vaginal discharge.   Denies back pain or muscle pain/cramps. Denies itching, rash, or wounds. Denies dizziness, headaches, numbness or seizures. Denies swollen lymph nodes or glands, denies easy bruising or bleeding. Denies anxiety, depression, confusion, or decreased concentration.  Physical Exam:  Vital Signs for this encounter:  Blood pressure 138/84, pulse 88, temperature 99.5 F (37.5 C), temperature source Oral, resp. rate 19, height 5' 1 (1.549 m), weight 158 lb 12.8 oz (72 kg), SpO2 100%. Body mass index is 30 kg/m. General: Alert, oriented, no acute distress.  HEENT: Normocephalic, atraumatic. Sclera anicteric.  Chest: Clear to auscultation bilaterally. No wheezes, rhonchi, or rales. Cardiovascular: Regular rate and rhythm, no murmurs, rubs, or gallops.  Abdomen: Normoactive bowel sounds. Soft, nondistended, nontender to palpation. No masses or hepatosplenomegaly appreciated. No palpable fluid wave.  Extremities: Grossly normal range of motion. Warm, well perfused. No edema bilaterally.  Skin: No rashes or lesions.  Lymphatics: No cervical, supraclavicular, or inguinal adenopathy.  GU:  External genitalia notable for loss of architecture, more on the left than the right with almost complete obliteration of the labia minora.  There is minimal erythema and no cigarette paper-appearing tissue.  Mildly atrophic along the posterior fourchette and perineum.  No discrete vulvar lesions.               Bladder/urethra:  No lesions or masses, well supported bladder             Vagina: Moderately  atrophic.             Cervix: Normal appearing, no lesions.             Uterus: Small, mobile, no parametrial involvement or nodularity.             Adnexa: No masses appreciated.  Rectal: Deferred.  Colposcopy Preoperative diagnosis: Vaginal dysplasia Postoperative diagnosis: Same as above Physician: Viktoria MD Estimated blood loss: Minimal Specimens: none Procedure: After the procedure was discussed with the patient including risks and benefits, she gave verbal consent.  She was then placed in dorsolithotomy position.  Pelvic exam had already been performed with findings noted  above.  Acetic acid  was applied to the vulva and distal 3 cm of the vagina.  Colposcopy was then performed with findings of mild acetowhite changes from the introitus to the perineum from approximately 3-9 o'clock.  No significant areas of punctations or mosaicism.  No ulcerative lesions.  Colposcopic impression: At most low-grade dysplasia.   LABORATORY AND RADIOLOGIC DATA:  Outside medical records were reviewed to synthesize the above history, along with the history and physical obtained during the visit.   Lab Results  Component Value Date   WBC 4.4 08/17/2023   HGB 14.6 08/17/2023   HCT 42.5 08/17/2023   PLT 164 08/17/2023       [1] No Known Allergies

## 2024-09-04 ENCOUNTER — Encounter: Payer: Self-pay | Admitting: Gynecologic Oncology

## 2024-09-04 ENCOUNTER — Inpatient Hospital Stay: Attending: Gynecologic Oncology | Admitting: Gynecologic Oncology

## 2024-09-04 VITALS — BP 138/84 | HR 88 | Temp 99.5°F | Resp 19 | Ht 61.0 in | Wt 158.8 lb

## 2024-09-04 DIAGNOSIS — N890 Mild vaginal dysplasia: Secondary | ICD-10-CM

## 2024-09-04 DIAGNOSIS — M8580 Other specified disorders of bone density and structure, unspecified site: Secondary | ICD-10-CM | POA: Insufficient documentation

## 2024-09-04 DIAGNOSIS — Z85828 Personal history of other malignant neoplasm of skin: Secondary | ICD-10-CM | POA: Insufficient documentation

## 2024-09-04 DIAGNOSIS — N952 Postmenopausal atrophic vaginitis: Secondary | ICD-10-CM

## 2024-09-04 DIAGNOSIS — L900 Lichen sclerosus et atrophicus: Secondary | ICD-10-CM

## 2024-09-04 DIAGNOSIS — Z87412 Personal history of vulvar dysplasia: Secondary | ICD-10-CM

## 2024-09-04 DIAGNOSIS — M1990 Unspecified osteoarthritis, unspecified site: Secondary | ICD-10-CM | POA: Insufficient documentation

## 2024-09-04 DIAGNOSIS — N904 Leukoplakia of vulva: Secondary | ICD-10-CM | POA: Diagnosis not present

## 2024-09-04 DIAGNOSIS — Z79899 Other long term (current) drug therapy: Secondary | ICD-10-CM | POA: Diagnosis not present

## 2024-09-04 DIAGNOSIS — N891 Moderate vaginal dysplasia: Secondary | ICD-10-CM | POA: Insufficient documentation

## 2024-09-04 NOTE — Patient Instructions (Signed)
 It was very nice to meet you today.  I will see you for follow-up in 6 months.  We will repeat the same exam.  Please let me know if you develop any new symptoms in the meantime such as bleeding, increasing vulvar/vaginal irritation or pain.  You can transition to using your vaginal estrogen twice a week and continuing the betazole twice a week.  The month before you come in to see me, please start using the vaginal estrogen every night again.

## 2025-03-06 ENCOUNTER — Inpatient Hospital Stay: Admitting: Gynecologic Oncology
